# Patient Record
Sex: Male | Born: 1960 | Race: Black or African American | Hispanic: No | Marital: Married | Smoking: Never smoker
Health system: Southern US, Community
[De-identification: ages and names within clinical notes are randomized; demographics above are authoritative.]

## PROBLEM LIST (undated history)

## (undated) DIAGNOSIS — J302 Other seasonal allergic rhinitis: Secondary | ICD-10-CM

## (undated) DIAGNOSIS — M2142 Flat foot [pes planus] (acquired), left foot: Secondary | ICD-10-CM

## (undated) DIAGNOSIS — M26629 Arthralgia of temporomandibular joint, unspecified side: Secondary | ICD-10-CM

## (undated) DIAGNOSIS — T7840XA Allergy, unspecified, initial encounter: Secondary | ICD-10-CM

## (undated) DIAGNOSIS — M722 Plantar fascial fibromatosis: Secondary | ICD-10-CM

## (undated) DIAGNOSIS — Z0000 Encounter for general adult medical examination without abnormal findings: Secondary | ICD-10-CM

## (undated) DIAGNOSIS — M2141 Flat foot [pes planus] (acquired), right foot: Secondary | ICD-10-CM

## (undated) DIAGNOSIS — K219 Gastro-esophageal reflux disease without esophagitis: Secondary | ICD-10-CM

## (undated) DIAGNOSIS — R945 Abnormal results of liver function studies: Secondary | ICD-10-CM

## (undated) DIAGNOSIS — E785 Hyperlipidemia, unspecified: Secondary | ICD-10-CM

## (undated) DIAGNOSIS — R000 Tachycardia, unspecified: Secondary | ICD-10-CM

## (undated) HISTORY — DX: Tachycardia, unspecified: R00.0

## (undated) HISTORY — DX: Encounter for general adult medical examination without abnormal findings: Z00.00

## (undated) HISTORY — PX: ESOPHAGOGASTRODUODENOSCOPY: SHX1529

## (undated) HISTORY — DX: Plantar fascial fibromatosis: M72.2

## (undated) HISTORY — DX: Allergy, unspecified, initial encounter: T78.40XA

## (undated) HISTORY — DX: Flat foot (pes planus) (acquired), right foot: M21.41

## (undated) HISTORY — DX: Abnormal results of liver function studies: R94.5

## (undated) HISTORY — DX: Gastro-esophageal reflux disease without esophagitis: K21.9

## (undated) HISTORY — DX: Flat foot (pes planus) (acquired), right foot: M21.42

## (undated) HISTORY — PX: UPPER GASTROINTESTINAL ENDOSCOPY: SHX188

## (undated) HISTORY — PX: COLONOSCOPY: SHX174

## (undated) HISTORY — DX: Arthralgia of temporomandibular joint, unspecified side: M26.629

## (undated) HISTORY — DX: Other seasonal allergic rhinitis: J30.2

## (undated) HISTORY — DX: Hyperlipidemia, unspecified: E78.5

---

## 1999-03-05 ENCOUNTER — Emergency Department (HOSPITAL_COMMUNITY): Admission: EM | Admit: 1999-03-05 | Discharge: 1999-03-05 | Payer: Self-pay | Admitting: Emergency Medicine

## 1999-06-21 ENCOUNTER — Emergency Department (HOSPITAL_COMMUNITY): Admission: EM | Admit: 1999-06-21 | Discharge: 1999-06-21 | Payer: Self-pay | Admitting: Emergency Medicine

## 2002-03-20 ENCOUNTER — Ambulatory Visit (HOSPITAL_COMMUNITY): Admission: RE | Admit: 2002-03-20 | Discharge: 2002-03-20 | Payer: Self-pay | Admitting: Gastroenterology

## 2007-12-22 ENCOUNTER — Ambulatory Visit: Payer: Self-pay | Admitting: *Deleted

## 2007-12-22 DIAGNOSIS — H109 Unspecified conjunctivitis: Secondary | ICD-10-CM | POA: Insufficient documentation

## 2007-12-22 DIAGNOSIS — J301 Allergic rhinitis due to pollen: Secondary | ICD-10-CM

## 2007-12-22 DIAGNOSIS — K219 Gastro-esophageal reflux disease without esophagitis: Secondary | ICD-10-CM

## 2007-12-22 DIAGNOSIS — K449 Diaphragmatic hernia without obstruction or gangrene: Secondary | ICD-10-CM

## 2008-01-11 ENCOUNTER — Ambulatory Visit: Payer: Self-pay | Admitting: *Deleted

## 2008-01-11 DIAGNOSIS — H5710 Ocular pain, unspecified eye: Secondary | ICD-10-CM | POA: Insufficient documentation

## 2008-04-02 ENCOUNTER — Ambulatory Visit: Payer: Self-pay | Admitting: *Deleted

## 2008-04-02 DIAGNOSIS — M722 Plantar fascial fibromatosis: Secondary | ICD-10-CM

## 2008-04-02 DIAGNOSIS — M2140 Flat foot [pes planus] (acquired), unspecified foot: Secondary | ICD-10-CM | POA: Insufficient documentation

## 2009-02-07 ENCOUNTER — Ambulatory Visit: Payer: Self-pay | Admitting: Internal Medicine

## 2009-02-07 DIAGNOSIS — R0989 Other specified symptoms and signs involving the circulatory and respiratory systems: Secondary | ICD-10-CM

## 2009-02-07 DIAGNOSIS — R0609 Other forms of dyspnea: Secondary | ICD-10-CM

## 2009-02-07 DIAGNOSIS — S93409A Sprain of unspecified ligament of unspecified ankle, initial encounter: Secondary | ICD-10-CM | POA: Insufficient documentation

## 2009-02-12 ENCOUNTER — Encounter: Payer: Self-pay | Admitting: Internal Medicine

## 2009-02-12 ENCOUNTER — Ambulatory Visit: Payer: Self-pay

## 2009-02-12 ENCOUNTER — Telehealth: Payer: Self-pay | Admitting: Internal Medicine

## 2009-03-04 ENCOUNTER — Ambulatory Visit: Payer: Self-pay | Admitting: Internal Medicine

## 2010-10-31 NOTE — Op Note (Signed)
   NAME:  Marc Allen, Marc Allen                           ACCOUNT NO.:  [id number]   MEDICAL RECORD NO.:  [id number]                   PATIENT TYPE:  AMB   LOCATION:  ENDO                                 FACILITY:  [facility]   PHYSICIAN:  James L. Malon Kindle., M.D.          DATE OF BIRTH:  1960-09-01   DATE OF PROCEDURE:  03/20/2002  DATE OF DISCHARGE:                                 OPERATIVE REPORT   PROCEDURE:  Esophagogastroduodenoscopy.   MEDICATIONS:  Cetacaine spray, fentanyl 50 mcg, Versed 5 mg IV.   INDICATIONS:  Dysphagia and chest pain.   DESCRIPTION OF PROCEDURE:  The procedure has been explained to the patient  and consent was obtained.  The patient in the left lateral decubitus  position, the Olympus video endoscope was inserted blindly in the esophagus  and advanced under direct visualization.  The stomach was entered, pylorus  was identified and passed.  The duodenum, including the bulb and second  portion were seen well and was unremarkable.  The scope was withdrawn back  into the stomach.  The duodenal bulb, pyloric channel, antrum, and body were  normal.  The fundus and cardia were seen well on retroflexed view and were  normal.  The distal esophagus was quite red and the GE junction was widely  patent.  There was wretching and reflux during the procedure.  The scope was  withdrawn and the patient tolerated the procedure well and was maintained on  low-flow oxygen and pulse oximeter throughout the procedure.   ASSESSMENT:  Gastroesophageal reflux disease.   PLAN:  Will give antireflux instructions, continue Aciphex, see back in the  office in 4-6 weeks.                                                James L. Malon Kindle., M.D.    Waldron Session  D:  03/20/2002  T:  03/20/2002  Job:  [id number]

## 2011-03-23 ENCOUNTER — Encounter: Payer: Self-pay | Admitting: Internal Medicine

## 2011-03-23 ENCOUNTER — Ambulatory Visit (INDEPENDENT_AMBULATORY_CARE_PROVIDER_SITE_OTHER)
Admission: RE | Admit: 2011-03-23 | Discharge: 2011-03-23 | Disposition: A | Discharge status: Home / Self Care | Payer: BC Managed Care – PPO | Source: Ambulatory Visit | Attending: Internal Medicine | Admitting: Internal Medicine

## 2011-03-23 ENCOUNTER — Ambulatory Visit (HOSPITAL_BASED_OUTPATIENT_CLINIC_OR_DEPARTMENT_OTHER)
Admission: RE | Admit: 2011-03-23 | Discharge: 2011-03-23 | Disposition: A | Discharge status: Home / Self Care | Payer: BC Managed Care – PPO | Source: Ambulatory Visit | Attending: Internal Medicine | Admitting: Internal Medicine

## 2011-03-23 ENCOUNTER — Other Ambulatory Visit: Payer: Self-pay | Admitting: Internal Medicine

## 2011-03-23 ENCOUNTER — Ambulatory Visit (INDEPENDENT_AMBULATORY_CARE_PROVIDER_SITE_OTHER): Payer: BC Managed Care – PPO | Admitting: Internal Medicine

## 2011-03-23 DIAGNOSIS — N433 Hydrocele, unspecified: Secondary | ICD-10-CM

## 2011-03-23 DIAGNOSIS — N509 Disorder of male genital organs, unspecified: Secondary | ICD-10-CM | POA: Insufficient documentation

## 2011-03-23 DIAGNOSIS — N50812 Left testicular pain: Secondary | ICD-10-CM

## 2011-03-23 DIAGNOSIS — Z23 Encounter for immunization: Secondary | ICD-10-CM

## 2011-03-23 DIAGNOSIS — R319 Hematuria, unspecified: Secondary | ICD-10-CM | POA: Insufficient documentation

## 2011-03-23 DIAGNOSIS — N50819 Testicular pain, unspecified: Secondary | ICD-10-CM | POA: Insufficient documentation

## 2011-03-23 LAB — URINALYSIS, ROUTINE W REFLEX MICROSCOPIC
Glucose, UA: NEGATIVE mg/dL
Protein, ur: 30 mg/dL — AB

## 2011-03-23 MED ORDER — LEVOFLOXACIN 500 MG PO TABS: 500.0000 mg | ORAL_TABLET | Freq: Every day | ORAL | Status: AC

## 2011-03-23 NOTE — Assessment & Plan Note (Signed)
With associated penile blood. Obtain ua with micro + cx. Begin levaquin x 10days. Obtain scrotal US. Schedule close followup. Consider urology evaluation pending re-evaluation.

## 2011-03-23 NOTE — Progress Notes (Signed)
  Subjective:    Patient ID: Marc Allen, male    DOB: 1960-07-22, 50 y.o.   MRN: [id number]  HPI Pt presents to clinic for evaluation of penile blood. Yesterday had erection then internal shaft discomfort. Pain developed in testicles and suprapubic area. This am noted blood on underwear. No gross active bleeding. Denies urethral discharge, fever, chills, change in urination or concern/risk for STD. Recalls single episode of hematospermia approximately 7 years ago without recurrence. No exacerbating or alleviating factors. No other complaints.  Past Medical History  Diagnosis Date  . Hiatal hernia   . GERD (gastroesophageal reflux disease)     not currenlty on any meds  . Seasonal allergies   . Plantar fasciitis   . Flat feet    Past Surgical History  Procedure Date  . No past surgeries     denies surgical history    reports that he has never smoked. He has never used smokeless tobacco. He reports that he does not drink alcohol or use illicit drugs. family history includes Alcohol abuse in his paternal grandfather and paternal uncle; Diabetes in his paternal uncle; Hypertension in his father; and Lung cancer in his maternal aunt.  There is no history of Colon cancer and Prostate cancer. No Known Allergies     Review of Systems see hpi    Objective:   Physical Exam  Nursing note and vitals reviewed. Constitutional: He appears well-developed and well-nourished. No distress.  HENT:  Head: Normocephalic and atraumatic.  Eyes: Conjunctivae are normal. No scleral icterus.  Genitourinary:       Small amount of dried blood in underwear. No blood or discharge noted in urethral meatus. Penis NT. Bilaterally descended testes. Minimal tenderness right epididymis. Left testicular and epididymis moderately tender. No obvious mass.  Neurological: He is alert.  Skin: Skin is warm and dry. He is not diaphoretic.  Psychiatric: He has a normal mood and affect.          Assessment & Plan:

## 2011-03-24 DIAGNOSIS — Z23 Encounter for immunization: Secondary | ICD-10-CM

## 2011-03-24 LAB — URINALYSIS, MICROSCOPIC ONLY
Bacteria, UA: NONE SEEN
Casts: NONE SEEN
Crystals: NONE SEEN
RBC / HPF: >50 RBC/hpf — AB
Squamous Epithelial / HPF: NONE SEEN

## 2011-03-27 ENCOUNTER — Ambulatory Visit (INDEPENDENT_AMBULATORY_CARE_PROVIDER_SITE_OTHER): Payer: BC Managed Care – PPO | Admitting: Internal Medicine

## 2011-03-27 ENCOUNTER — Encounter: Payer: Self-pay | Admitting: Internal Medicine

## 2011-03-27 VITALS — BP 96/64 | HR 54 | Temp 97.9°F | Resp 16

## 2011-03-27 DIAGNOSIS — N509 Disorder of male genital organs, unspecified: Secondary | ICD-10-CM

## 2011-03-27 DIAGNOSIS — N50819 Testicular pain, unspecified: Secondary | ICD-10-CM

## 2011-03-27 NOTE — Assessment & Plan Note (Signed)
Sx's improving with abx. Complete 10d course. Schedule follow up in week or sooner if needed.

## 2011-03-27 NOTE — Progress Notes (Signed)
  Subjective:    Patient ID: Marc Allen, male    DOB: August 12, 1960, 50 y.o.   MRN: [id number]  HPI Pt presents to clinic for follow up of testicular pain. Reviewed scrotal US without acute finding. Urine cx demonstrates >100k of group b strep. Tolerating day #3 of 10 of levaquin without side effect. Notes improvement with less testicular tenderness and has seen only minimal blood from urethra most recently. Denies f/c or urinary sx's. Notes h/o GERD s/p lifestyle changes such as avoiding food 3 hours prior to bedtime. Currently has only sporadic heartburn less than 2x/week. Not taking medication currently. Wishes to schedule physical this year. No other complaints.  Past Medical History  Diagnosis Date  . Hiatal hernia   . GERD (gastroesophageal reflux disease)     not currenlty on any meds  . Seasonal allergies   . Plantar fasciitis   . Flat feet    Past Surgical History  Procedure Date  . No past surgeries     denies surgical history    reports that he has never smoked. He has never used smokeless tobacco. He reports that he does not drink alcohol or use illicit drugs. family history includes Alcohol abuse in his paternal grandfather and paternal uncle; Diabetes in his paternal uncle; Hypertension in his father; and Lung cancer in his maternal aunt.  There is no history of Colon cancer and Prostate cancer. No Known Allergies     Review of Systems see hpi     Objective:   Physical Exam  Nursing note and vitals reviewed. Constitutional: He appears well-developed and well-nourished. No distress.  Neurological: He is alert.  Skin: He is not diaphoretic.  Psychiatric: He has a normal mood and affect.          Assessment & Plan:

## 2011-04-03 ENCOUNTER — Encounter: Payer: Self-pay | Admitting: Internal Medicine

## 2011-04-03 ENCOUNTER — Ambulatory Visit (INDEPENDENT_AMBULATORY_CARE_PROVIDER_SITE_OTHER): Payer: BC Managed Care – PPO | Admitting: Internal Medicine

## 2011-04-03 VITALS — BP 100/60 | HR 60 | Temp 98.0°F | Resp 16 | Wt 163.0 lb

## 2011-04-03 DIAGNOSIS — N50819 Testicular pain, unspecified: Secondary | ICD-10-CM

## 2011-04-03 DIAGNOSIS — N509 Disorder of male genital organs, unspecified: Secondary | ICD-10-CM

## 2011-04-03 MED ORDER — LEVOFLOXACIN 500 MG PO TABS: 500.0000 mg | ORAL_TABLET | Freq: Every day | ORAL | Status: AC

## 2011-04-03 NOTE — Progress Notes (Signed)
  Subjective:    Patient ID: Marc Allen, male    DOB: September 13, 1960, 50 y.o.   MRN: [id number]  HPI Pt presents to clinic for follow up of testicular pain. With #9/10 abx his testicular tenderness/pain has nearly resolved. Denies any further hematuria. Does have mild urinary urgency. overally feels 70% better. No fever or chills. No other complaints.  Past Medical History  Diagnosis Date  . Hiatal hernia   . GERD (gastroesophageal reflux disease)     not currenlty on any meds  . Seasonal allergies   . Plantar fasciitis   . Flat feet    Past Surgical History  Procedure Date  . No past surgeries     denies surgical history    reports that he has never smoked. He has never used smokeless tobacco. He reports that he does not drink alcohol or use illicit drugs. family history includes Alcohol abuse in his paternal grandfather and paternal uncle; Diabetes in his paternal uncle; Hypertension in his father; and Lung cancer in his maternal aunt.  There is no history of Colon cancer and Prostate cancer. No Known Allergies     Review of Systems see hpi     Objective:   Physical Exam  Nursing note and vitals reviewed. Constitutional: He appears well-developed and well-nourished. No distress.  HENT:  Head: Normocephalic and atraumatic.  Eyes: Conjunctivae are normal. No scleral icterus.  Neurological: He is alert.  Skin: He is not diaphoretic.  Psychiatric: He has a normal mood and affect.          Assessment & Plan:

## 2011-04-03 NOTE — Patient Instructions (Signed)
Please schedule fasting labs before your physical: cbc, chem7, lipid, lft, tsh, urinalysis v70.0

## 2011-04-03 NOTE — Assessment & Plan Note (Signed)
Associated gross hematuria now resolved. Urine cx + for group b strep. Extend levaquin for additional 10 days. If sx's do not entire resolve, worsen or recur then pursue urology consult. Schedule cpe in ~ 3 wks.

## 2011-04-27 ENCOUNTER — Encounter: Payer: Self-pay | Admitting: Internal Medicine

## 2011-04-27 ENCOUNTER — Ambulatory Visit (INDEPENDENT_AMBULATORY_CARE_PROVIDER_SITE_OTHER): Payer: BC Managed Care – PPO | Admitting: Internal Medicine

## 2011-04-27 VITALS — BP 100/70 | HR 50 | Temp 98.1°F | Resp 16 | Ht 72.0 in | Wt 164.0 lb

## 2011-04-27 DIAGNOSIS — Z0000 Encounter for general adult medical examination without abnormal findings: Secondary | ICD-10-CM

## 2011-04-27 LAB — URINALYSIS, ROUTINE W REFLEX MICROSCOPIC
Ketones, ur: NEGATIVE mg/dL
Leukocytes, UA: NEGATIVE
Nitrite: NEGATIVE
Specific Gravity, Urine: 1.024 (ref 1.005–1.030)
pH: 6 (ref 5.0–8.0)

## 2011-04-27 LAB — PSA: PSA: 0.94 ng/mL (ref <=4.00)

## 2011-04-27 LAB — BASIC METABOLIC PANEL
CO2: 26 mEq/L (ref 19–32)
Calcium: 9.9 mg/dL (ref 8.4–10.5)
Creat: 0.89 mg/dL (ref 0.50–1.35)
Glucose, Bld: 82 mg/dL (ref 70–99)
Sodium: 143 mEq/L (ref 135–145)

## 2011-04-27 LAB — HEPATIC FUNCTION PANEL
AST: 20 U/L (ref 0–37)
Bilirubin, Direct: 0.2 mg/dL (ref 0.0–0.3)
Total Bilirubin: 0.4 mg/dL (ref 0.3–1.2)

## 2011-04-27 LAB — TSH: TSH: 0.558 u[IU]/mL (ref 0.350–4.500)

## 2011-04-27 LAB — CBC
Hemoglobin: 14.7 g/dL (ref 13.0–17.0)
MCH: 33.3 pg (ref 26.0–34.0)
MCV: 94.8 fL (ref 78.0–100.0)
RBC: 4.41 MIL/uL (ref 4.22–5.81)

## 2011-04-27 LAB — LIPID PANEL: Total CHOL/HDL Ratio: 3 Ratio

## 2011-04-27 MED ORDER — DICLOFENAC SODIUM 75 MG PO TBEC: DELAYED_RELEASE_TABLET | ORAL | Status: AC

## 2011-04-27 NOTE — Patient Instructions (Addendum)
Please schedule cbc, chem7, lipid, liver, tsh, psa, ua v70.0 prior to next year's physical

## 2011-04-27 NOTE — Progress Notes (Signed)
  Subjective:    Patient ID: Marc Allen, male    DOB: 11-09-1960, 50 y.o.   MRN: [id number]  HPI Pt presents to clinic for annual physical. No further hematuria or testicular pain s/p abx tx for +urine cx. Notes post neck pain in area of c7/t1 without trauma or radicular sx's.also right knee pain without injury or instability. Not taking medication for the problems. No other complaints  Past Medical History  Diagnosis Date  . Hiatal hernia   . GERD (gastroesophageal reflux disease)     not currenlty on any meds  . Seasonal allergies   . Plantar fasciitis   . Flat feet    Past Surgical History  Procedure Date  . No past surgeries     denies surgical history    reports that he has never smoked. He has never used smokeless tobacco. He reports that he does not drink alcohol or use illicit drugs. family history includes Alcohol abuse in his paternal grandfather and paternal uncle; Diabetes in his paternal uncle; Hypertension in his father; and Lung cancer in his maternal aunt.  There is no history of Colon cancer and Prostate cancer. No Known Allergies     Review of Systems see hpi     Objective:   Physical Exam  Physical Exam  Nursing note and vitals reviewed. Constitutional: He appears well-developed and well-nourished. No distress.  HENT:  Head: Normocephalic and atraumatic.  Right Ear: Tympanic membrane and external ear normal.  Left Ear: Tympanic membrane and external ear normal.  Nose: Nose normal.  Mouth/Throat: Uvula is midline, oropharynx is clear and moist and mucous membranes are normal. No oropharyngeal exudate.  Eyes: Conjunctivae and EOM are normal. Pupils are equal, round, and reactive to light. Right eye exhibits no discharge. Left eye exhibits no discharge. No scleral icterus.  Neck: Neck supple. Carotid bruit is not present. No thyromegaly present.  Cardiovascular: Normal rate, regular rhythm and normal heart sounds.  Exam reveals no gallop and no friction  rub.   No murmur Wing. Pulmonary/Chest: Effort normal and breath sounds normal. No respiratory distress. He has no wheezes. He has no rales.  Abdominal: Soft. He exhibits no distension and no mass. There is no hepatosplenomegaly. There is no tenderness. There is no rebound. Hernia confirmed negative in the right inguinal area and confirmed negative in the left inguinal area.  Genitourinary:Right testis shows no mass, no swelling and no tenderness. Right testis is descended. Left testis shows no mass, no swelling and no tenderness. Left testis is descended.  Lymphadenopathy:    He has no cervical adenopathy.       Right: No inguinal adenopathy present.       Left: No inguinal adenopathy present.  Neurological: He is alert.  Skin: Skin is warm and dry. He is not diaphoretic.  Psychiatric: He has a normal mood and affect.   MSK: FROM right knee and neck. No post vert tenderness or bony abn. Right knee with mild tenderness along medial aspect. No erythema warmth or effusion.     Assessment & Plan:

## 2011-05-03 DIAGNOSIS — Z0000 Encounter for general adult medical examination without abnormal findings: Secondary | ICD-10-CM

## 2011-05-03 HISTORY — DX: Encounter for general adult medical examination without abnormal findings: Z00.00

## 2011-05-03 NOTE — Assessment & Plan Note (Signed)
Obtain screening labs including psa (discussed pro's and cons). ekg obtained shows sb 48 with nl intervals and axis. Attempt voltaren with food and no other nsaids for knee and neck pain. Followup if no improvement or worsening.

## 2012-02-24 ENCOUNTER — Ambulatory Visit (INDEPENDENT_AMBULATORY_CARE_PROVIDER_SITE_OTHER): Payer: BC Managed Care – PPO | Admitting: Internal Medicine

## 2012-02-24 ENCOUNTER — Encounter: Payer: Self-pay | Admitting: Internal Medicine

## 2012-02-24 VITALS — BP 116/82 | HR 65 | Temp 98.6°F | Resp 18 | Wt 157.0 lb

## 2012-02-24 DIAGNOSIS — R509 Fever, unspecified: Secondary | ICD-10-CM

## 2012-02-24 DIAGNOSIS — B349 Viral infection, unspecified: Secondary | ICD-10-CM

## 2012-02-24 DIAGNOSIS — B9789 Other viral agents as the cause of diseases classified elsewhere: Secondary | ICD-10-CM

## 2012-02-24 DIAGNOSIS — E860 Dehydration: Secondary | ICD-10-CM

## 2012-02-24 LAB — CBC WITH DIFFERENTIAL/PLATELET
Lymphocytes Relative: 7 % — ABNORMAL LOW (ref 12–46)
Lymphs Abs: 0.9 10*3/uL (ref 0.7–4.0)
MCV: 93.3 fL (ref 78.0–100.0)
Neutro Abs: 10.5 10*3/uL — ABNORMAL HIGH (ref 1.7–7.7)
Neutrophils Relative %: 88 % — ABNORMAL HIGH (ref 43–77)
Platelets: 218 10*3/uL (ref 150–400)
RBC: 4.49 MIL/uL (ref 4.22–5.81)
WBC: 11.9 10*3/uL — ABNORMAL HIGH (ref 4.0–10.5)

## 2012-02-24 MED ORDER — ONDANSETRON HCL 8 MG PO TABS: 8.0000 mg | ORAL_TABLET | Freq: Three times a day (TID) | ORAL | Status: AC | PRN

## 2012-02-25 LAB — BASIC METABOLIC PANEL
CO2: 29 mEq/L (ref 19–32)
Chloride: 98 mEq/L (ref 96–112)
Sodium: 137 mEq/L (ref 135–145)

## 2012-03-02 ENCOUNTER — Other Ambulatory Visit: Payer: Self-pay | Admitting: *Deleted

## 2012-03-02 MED ORDER — POTASSIUM CHLORIDE CRYS ER 20 MEQ PO TBCR: 20.0000 meq | EXTENDED_RELEASE_TABLET | Freq: Every day | ORAL | Status: DC

## 2012-03-04 DIAGNOSIS — B349 Viral infection, unspecified: Secondary | ICD-10-CM | POA: Insufficient documentation

## 2012-03-04 NOTE — Progress Notes (Signed)
  Subjective:    Patient ID: RYSHAWN WIEGAND, male    DOB: [birth year], 51 y.o.   MRN: [id number]  HPI patient presents to clinic for evaluation of multiple complaints. Notes diffuse joint pain, abdominal bloating, loss of appetite nausea vomiting diarrhea sweats chills and fevers for the past three days. Had sudden onset with temperature maximum of 101.9. Has had loose watery stool without blood. At 5-10 bowel movements yesterday today only 3-4. No alleviating or exacerbating factors. Taking no medication for the problem.  Past Medical History  Diagnosis Date  . Hiatal hernia   . GERD (gastroesophageal reflux disease)     not currenlty on any meds  . Seasonal allergies   . Plantar fasciitis   . Flat feet    Past Surgical History  Procedure Date  . No past surgeries     denies surgical history    reports that he has never smoked. He has never used smokeless tobacco. He reports that he does not drink alcohol or use illicit drugs. family history includes Alcohol abuse in his paternal grandfather and paternal uncle; Diabetes in his paternal uncle; Hypertension in his father; and Lung cancer in his maternal aunt.  There is no history of Colon cancer and Prostate cancer. No Known Allergies   Review of Systems see hpi     Objective:   Physical Exam  Constitutional: He appears well-developed and well-nourished. No distress.  HENT:  Head: Normocephalic and atraumatic.  Left Ear: External ear normal.  Eyes: Conjunctivae normal are normal. No scleral icterus.  Neck: Neck supple.  Cardiovascular: Normal rate, regular rhythm and normal heart sounds.   No murmur [place]. Pulmonary/Chest: Effort normal and breath sounds normal. No respiratory distress. He has no wheezes. He has no rales.  Abdominal: Soft. Bowel sounds are normal. He exhibits no distension. There is no tenderness.  Neurological: He is alert.  Skin: Skin is warm and dry. He is not diaphoretic.  Psychiatric: He has a normal mood  and affect.          Assessment & Plan:

## 2012-03-04 NOTE — Assessment & Plan Note (Signed)
Suspect a viral etiology for possible gastroenteritis. Increase by mouth fluid intake. Obtain CBC and Chem-7. Use over-the-counter Imodium when necessary. Begin Zofran when necessary. Followup if no improvement or worsening.

## 2012-03-07 ENCOUNTER — Ambulatory Visit: Payer: BC Managed Care – PPO | Admitting: Internal Medicine

## 2012-03-08 ENCOUNTER — Other Ambulatory Visit: Payer: Self-pay | Admitting: Family

## 2012-03-08 ENCOUNTER — Encounter: Payer: Self-pay | Admitting: Family

## 2012-03-08 ENCOUNTER — Ambulatory Visit (INDEPENDENT_AMBULATORY_CARE_PROVIDER_SITE_OTHER): Payer: BC Managed Care – PPO | Admitting: Family

## 2012-03-08 VITALS — BP 104/80 | HR 66 | Temp 98.0°F | Resp 16 | Wt 157.1 lb

## 2012-03-08 DIAGNOSIS — R300 Dysuria: Secondary | ICD-10-CM

## 2012-03-08 DIAGNOSIS — D72829 Elevated white blood cell count, unspecified: Secondary | ICD-10-CM

## 2012-03-08 DIAGNOSIS — N390 Urinary tract infection, site not specified: Secondary | ICD-10-CM

## 2012-03-08 DIAGNOSIS — E876 Hypokalemia: Secondary | ICD-10-CM

## 2012-03-08 DIAGNOSIS — Z23 Encounter for immunization: Secondary | ICD-10-CM

## 2012-03-08 LAB — POCT URINALYSIS DIPSTICK
Bilirubin, UA: NEGATIVE
Glucose, UA: NEGATIVE
Nitrite, UA: NEGATIVE
Spec Grav, UA: <=1.005

## 2012-03-08 LAB — BASIC METABOLIC PANEL
Chloride: 102 mEq/L (ref 96–112)
Creat: 0.78 mg/dL (ref 0.50–1.35)
Potassium: 4.7 mEq/L (ref 3.5–5.3)

## 2012-03-08 LAB — CBC WITH DIFFERENTIAL/PLATELET
Eosinophils Absolute: 0.1 10*3/uL (ref 0.0–0.7)
Hemoglobin: 14.8 g/dL (ref 13.0–17.0)
Lymphocytes Relative: 34 % (ref 12–46)
Lymphs Abs: 2.2 10*3/uL (ref 0.7–4.0)
MCH: 32.8 pg (ref 26.0–34.0)
Monocytes Relative: 9 % (ref 3–12)
Neutrophils Relative %: 55 % (ref 43–77)
Platelets: 390 10*3/uL (ref 150–400)
RBC: 4.51 MIL/uL (ref 4.22–5.81)
WBC: 6.4 10*3/uL (ref 4.0–10.5)

## 2012-03-08 MED ORDER — CIPROFLOXACIN HCL 500 MG PO TABS: 500.0000 mg | ORAL_TABLET | Freq: Two times a day (BID) | ORAL | Status: DC

## 2012-03-08 NOTE — Patient Instructions (Addendum)
Call if symptoms worsen, if you develop fever, blood in urine,low back pain, or if not improved in 2-3 days. Go to ER if you are unable to urinate.

## 2012-03-08 NOTE — Progress Notes (Signed)
  Subjective:    Patient ID: Marc Allen, male    DOB: 11/27/1960, 51 y.o.   MRN: [id number]  HPI  Mr.  Marc Allen is a 51 yr old male who presents today with chief complaint of dysuria.  He reports that dysuria has been present x 2 months.  He report + hx of prostatitis. Denies associated low back pain, fever or hematuria.    Review of Systems    see HPI  Past Medical History  Diagnosis Date  . Hiatal hernia   . GERD (gastroesophageal reflux disease)     not currenlty on any meds  . Seasonal allergies   . Plantar fasciitis   . Flat feet     History   Social History  . Marital Status: Married    Spouse Name: N/A    Number of Children: N/A  . Years of Education: N/A   Occupational History  . Not on file.   Social History Main Topics  . Smoking status: Never Smoker   . Smokeless tobacco: Never Used  . Alcohol Use: No  . Drug Use: No  . Sexually Active: Not on file   Other Topics Concern  . Not on file   Social History Narrative   [employer] DriverMarried x 15 years    Past Surgical History  Procedure Date  . No past surgeries     denies surgical history    Family History  Problem Relation Age of Onset  . Alcohol abuse Paternal Grandfather   . Alcohol abuse Paternal Uncle   . Diabetes Paternal Uncle   . Hypertension Father   . Lung cancer Maternal Aunt     smoker  . Colon cancer Neg Hx   . Prostate cancer Neg Hx     No Known Allergies  Current Outpatient Prescriptions on File Prior to Visit  Medication Sig Dispense Refill  . potassium chloride SA (K-DUR,KLOR-CON) 20 MEQ tablet Take 1 tablet (20 mEq total) by mouth daily.  3 tablet  0    BP 104/80  Pulse 66  Temp 98 F (36.7 C) (Oral)  Resp 16  Wt 157 lb 1.3 oz (71.251 kg)  SpO2 99%    Objective:   Physical Exam  Constitutional: He is oriented to person, place, and time. He appears well-developed and well-nourished. No distress.  Cardiovascular: Normal rate and regular rhythm.   No murmur  [place]. Pulmonary/Chest: Effort normal and breath sounds normal. No respiratory distress. He has no wheezes. He has no rales. He exhibits no tenderness.  Abdominal: Soft. Bowel sounds are normal. He exhibits no distension. There is no tenderness.  Genitourinary:       Neg CVAT bilaterally.  Musculoskeletal: He exhibits no edema.  Neurological: He is alert and oriented to person, place, and time.  Psychiatric: He has a normal mood and affect. His behavior is normal. Judgment and thought content normal.          Assessment & Plan:

## 2012-03-09 LAB — GC/CHLAMYDIA PROBE AMP, URINE
Chlamydia, Swab/Urine, PCR: NEGATIVE
GC Probe Amp, Urine: NEGATIVE

## 2012-03-10 LAB — URINE CULTURE: Colony Count: 40000

## 2012-03-11 ENCOUNTER — Telehealth: Payer: Self-pay | Admitting: *Deleted

## 2012-03-11 NOTE — Telephone Encounter (Signed)
Patient notified of test results negative and  Urine culture with bacteria and the importance of taking antibiotic. Patient states will pick up antibiotic today and get started with it. Has moved follow up appt. With Dr Ty Hilts to 04/25/2012 due to work.

## 2012-03-11 NOTE — Telephone Encounter (Signed)
Message copied by Elnora Morrison on Fri Mar 11, 2012  1:15 PM ------      Message from: O'SULLIVAN, MELISSA      Created: Fri Mar 11, 2012 12:56 PM       Pls call pt and let him know that his gonorrhea/chlamydia screen is negative. Urine culture grew a small amount of bacteria.  I would like him to continue cipro and plan to follow up with Dr. Rodena Medin as scheduled.  Let us know if symptoms worsen, or if no improvement.

## 2012-03-13 DIAGNOSIS — N419 Inflammatory disease of prostate, unspecified: Secondary | ICD-10-CM | POA: Insufficient documentation

## 2012-03-13 NOTE — Assessment & Plan Note (Signed)
Will plan to treat for UTI.  UA notes trace blood and + leuks.  Culture urine.  Rx with cipro x 10 days due to hx of prostatitis.

## 2012-03-14 ENCOUNTER — Telehealth: Payer: Self-pay | Admitting: *Deleted

## 2012-03-14 LAB — PSA: PSA: 14.73 ng/mL — ABNORMAL HIGH (ref <=4.00)

## 2012-03-14 NOTE — Telephone Encounter (Signed)
Lab states they did not receive the order. Test has been added.

## 2012-03-14 NOTE — Telephone Encounter (Signed)
Message copied by Kathi Simpers on Mon Mar 14, 2012  9:07 AM ------      Message from: [place], MELISSA      Created: Sun Mar 13, 2012  9:06 PM       Could you pls check psa status from 9/24? thanks

## 2012-03-15 ENCOUNTER — Ambulatory Visit: Payer: BC Managed Care – PPO | Admitting: Internal Medicine

## 2012-03-15 DIAGNOSIS — Z0289 Encounter for other administrative examinations: Secondary | ICD-10-CM

## 2012-03-17 ENCOUNTER — Encounter: Payer: Self-pay | Admitting: Internal Medicine

## 2012-03-17 ENCOUNTER — Ambulatory Visit (INDEPENDENT_AMBULATORY_CARE_PROVIDER_SITE_OTHER): Payer: BC Managed Care – PPO | Admitting: Internal Medicine

## 2012-03-17 VITALS — BP 100/70 | HR 60 | Temp 97.6°F | Resp 16 | Wt 159.0 lb

## 2012-03-17 DIAGNOSIS — N419 Inflammatory disease of prostate, unspecified: Secondary | ICD-10-CM

## 2012-03-20 NOTE — Assessment & Plan Note (Signed)
Continue antibiotics to completion. Notify clinic of progress. If  does not respond to Cipro change to antibiotic appropriate to group B strep. Recommend repeat PSA after clinical improvement

## 2012-03-20 NOTE — Progress Notes (Signed)
  Subjective:    Patient ID: Marc Allen, male    DOB: [birth year], 51 y.o.   MRN: [id number]  HPI patient presents to clinic for followup of prostatitis. Recently seen with dysuria and dribbling. Urine culture demonstrated small quantity group B strep. Was prescribed ten days of Cipro however did not begin the medication until approximately 2 days ago. Symptoms are stable. Denies fever or chills. No hematuria. Reviewed elevated PSA of approximately 14 with baseline being normal. Total time of visit approximately twenty-two minutes of which greater than 50% of time was spent in counseling  Past Medical History  Diagnosis Date  . Hiatal hernia   . GERD (gastroesophageal reflux disease)     not currenlty on any meds  . Seasonal allergies   . Plantar fasciitis   . Flat feet    Past Surgical History  Procedure Date  . No past surgeries     denies surgical history    reports that he has never smoked. He has never used smokeless tobacco. He reports that he does not drink alcohol or use illicit drugs. family history includes Alcohol abuse in his paternal grandfather and paternal uncle; Diabetes in his paternal uncle; Hypertension in his father; and Lung cancer in his maternal aunt.  There is no history of Colon cancer and Prostate cancer. No Known Allergies   Review of Systems see hpi     Objective:   Physical Exam  Nursing note and vitals reviewed. Constitutional: He appears well-developed and well-nourished. No distress.  HENT:  Head: Normocephalic and atraumatic.  Neurological: He is alert.  Skin: He is not diaphoretic.  Psychiatric: He has a normal mood and affect.          Assessment & Plan:

## 2012-04-04 ENCOUNTER — Telehealth: Payer: Self-pay | Admitting: Internal Medicine

## 2012-04-04 MED ORDER — CIPROFLOXACIN HCL 500 MG PO TABS: 500.0000 mg | ORAL_TABLET | Freq: Two times a day (BID) | ORAL | Status: DC

## 2012-04-04 NOTE — Telephone Encounter (Signed)
Ok to repeat 

## 2012-04-04 NOTE — Telephone Encounter (Signed)
HE FEELS HE NEEDS ANOTHER 10 DAYS ON ANTIBIOTIC.  HE IS FEELING BETTER BUT THE SYMPTOMS HAVE NOT RESOLVED COMPLETELY.

## 2012-04-04 NOTE — Telephone Encounter (Signed)
ABX Rx to pharmacy; inform patient/SLS

## 2012-04-15 ENCOUNTER — Telehealth: Payer: Self-pay | Admitting: Internal Medicine

## 2012-04-15 DIAGNOSIS — R972 Elevated prostate specific antigen [PSA]: Secondary | ICD-10-CM

## 2012-04-15 NOTE — Telephone Encounter (Signed)
Patient states that he is still experiencing symptoms of uti. He says that his symptoms are lessening but are still there and he says that he is still taking the antibiotics that were prescribed to him. He would like to know if Dr. Rodena Medin could refer him to a Urologist?

## 2012-04-17 NOTE — Telephone Encounter (Signed)
1) he was supposed to call if cipro wasn't working well enough and i was going to switch abx. We discussed that in detail because of his urine cx. Stop cipro. Take amox 500mg  tid x14 days if not allergic. 2) return to lab and repeat psa- dx elevated psa 3) if different abx doesn't take care of sx then definitely urologist

## 2012-04-21 ENCOUNTER — Ambulatory Visit (INDEPENDENT_AMBULATORY_CARE_PROVIDER_SITE_OTHER): Payer: BC Managed Care – PPO | Admitting: Internal Medicine

## 2012-04-21 ENCOUNTER — Encounter: Payer: Self-pay | Admitting: Internal Medicine

## 2012-04-21 VITALS — BP 100/78 | HR 88 | Temp 97.8°F | Resp 14 | Wt 162.1 lb

## 2012-04-21 DIAGNOSIS — R972 Elevated prostate specific antigen [PSA]: Secondary | ICD-10-CM

## 2012-04-21 LAB — PSA: PSA: 2.36 ng/mL (ref <=4.00)

## 2012-04-21 MED ORDER — AMOXICILLIN-POT CLAVULANATE 875-125 MG PO TABS: 1.0000 | ORAL_TABLET | Freq: Two times a day (BID) | ORAL | Status: AC

## 2012-04-21 NOTE — Telephone Encounter (Signed)
Attempt to reach pt [2nd call--first 11.04.13], spoke with mother-in-law, who stated pt will be home after 3:00p today/SLS

## 2012-04-21 NOTE — Assessment & Plan Note (Signed)
Symptoms improved but not resolved. Attempt course of Augmentin for 10 days. Repeat PSA. If symptoms not resolved with this additional course of antibiotics we'll recommend proceeding with urology consult.

## 2012-04-21 NOTE — Patient Instructions (Signed)
Please schedule a physical in January with fasting labs prior to the appointment if possible (cbc, chem7, lipid, a1c, tsh, lft, psa and ua with reflex micr0-v70.0)

## 2012-04-21 NOTE — Progress Notes (Signed)
  Subjective:    Patient ID: ESTELLE HERSTON, male    DOB: [birth year], 51 y.o.   MRN: [id number]  HPI patient presents to clinic for followup of prostatitis. Initially diagnosed with what was felt to be clinically prostatitis. However also had a urine culture will low colony count of group B strep. Took a ten-day course of Cipro with significant improvement initially he rated 50%. Took second course of Cipro 10 days with no further significant improvement. Has no dysuria but has bladder pressure. No fever or chills. PSA was elevated at the time of diagnosis. No other alleviating or exacerbating factors  Past Medical History  Diagnosis Date  . Hiatal hernia   . GERD (gastroesophageal reflux disease)     not currenlty on any meds  . Seasonal allergies   . Plantar fasciitis   . Flat feet    Past Surgical History  Procedure Date  . No past surgeries     denies surgical history    reports that he has never smoked. He has never used smokeless tobacco. He reports that he does not drink alcohol or use illicit drugs. family history includes Alcohol abuse in his paternal grandfather and paternal uncle; Diabetes in his paternal uncle; Hypertension in his father; and Lung cancer in his maternal aunt.  There is no history of Colon cancer and Prostate cancer. No Known Allergies  Review of Systems see hpi     Objective:   Physical Exam  Nursing note and vitals reviewed. Constitutional: He appears well-developed and well-nourished. No distress.  Skin: He is not diaphoretic.          Assessment & Plan:

## 2012-04-22 NOTE — Telephone Encounter (Signed)
Pt seen in office 11.07.13/SLS

## 2012-04-25 ENCOUNTER — Encounter: Payer: BC Managed Care – PPO | Admitting: Internal Medicine

## 2012-06-20 ENCOUNTER — Encounter: Payer: Self-pay | Admitting: Internal Medicine

## 2012-06-20 ENCOUNTER — Ambulatory Visit (INDEPENDENT_AMBULATORY_CARE_PROVIDER_SITE_OTHER): Payer: BC Managed Care – PPO | Admitting: Internal Medicine

## 2012-06-20 VITALS — BP 102/72 | HR 68 | Temp 97.9°F | Resp 16 | Ht 71.5 in | Wt 158.2 lb

## 2012-06-20 DIAGNOSIS — Z0000 Encounter for general adult medical examination without abnormal findings: Secondary | ICD-10-CM

## 2012-06-20 LAB — CBC WITH DIFFERENTIAL/PLATELET
Basophils Absolute: 0 10*3/uL (ref 0.0–0.1)
Eosinophils Relative: 1 % (ref 0–5)
Lymphocytes Relative: 35 % (ref 12–46)
Lymphs Abs: 1.1 10*3/uL (ref 0.7–4.0)
MCV: 92.5 fL (ref 78.0–100.0)
Neutro Abs: 1.8 10*3/uL (ref 1.7–7.7)
Neutrophils Relative %: 54 % (ref 43–77)
Platelets: 265 10*3/uL (ref 150–400)
RBC: 4.77 MIL/uL (ref 4.22–5.81)
RDW: 14.3 % (ref 11.5–15.5)
WBC: 3.3 10*3/uL — ABNORMAL LOW (ref 4.0–10.5)

## 2012-06-20 LAB — BASIC METABOLIC PANEL
CO2: 30 mEq/L (ref 19–32)
Chloride: 105 mEq/L (ref 96–112)
Creat: 0.93 mg/dL (ref 0.50–1.35)
Potassium: 4.5 mEq/L (ref 3.5–5.3)
Sodium: 143 mEq/L (ref 135–145)

## 2012-06-20 LAB — HEPATIC FUNCTION PANEL
ALT: 17 U/L (ref 0–53)
AST: 22 U/L (ref 0–37)
Bilirubin, Direct: 0.1 mg/dL (ref 0.0–0.3)
Indirect Bilirubin: 0.5 mg/dL (ref 0.0–0.9)
Total Protein: 7.4 g/dL (ref 6.0–8.3)

## 2012-06-20 LAB — LIPID PANEL
Cholesterol: 182 mg/dL (ref 0–200)
Total CHOL/HDL Ratio: 3.2 Ratio
Triglycerides: 40 mg/dL (ref <150)
VLDL: 8 mg/dL (ref 0–40)

## 2012-06-20 LAB — TSH: TSH: 0.718 u[IU]/mL (ref 0.350–4.500)

## 2012-06-20 LAB — HEMOGLOBIN A1C: Mean Plasma Glucose: 111 mg/dL (ref <117)

## 2012-06-20 NOTE — Assessment & Plan Note (Signed)
Nl exam. Obtain cpe labs. 

## 2012-06-20 NOTE — Progress Notes (Signed)
  Subjective:    Patient ID: Marc Allen, male    DOB: 1960-09-22, 52 y.o.   MRN: [id number]  HPI Pt presents to clinic for annual exam. Previous prostatitis sx's resolved after abx tx. Transient elevation of psa during prostatitis resolved after abx completion.   Past Medical History  Diagnosis Date  . Hiatal hernia   . GERD (gastroesophageal reflux disease)     not currenlty on any meds  . Seasonal allergies   . Plantar fasciitis   . Flat feet    Past Surgical History  Procedure Date  . No past surgeries     denies surgical history    reports that he has never smoked. He has never used smokeless tobacco. He reports that he does not drink alcohol or use illicit drugs. family history includes Alcohol abuse in his paternal grandfather and paternal uncle; Diabetes in his paternal uncle; Hypertension in his father; and Lung cancer in his maternal aunt.  There is no history of Colon cancer and Prostate cancer. No Known Allergies   Review of Systems see hpi     Objective:   Physical Exam  Physical Exam  Nursing note and vitals reviewed. Constitutional: He appears well-developed and well-nourished. No distress.  HENT:  Head: Normocephalic and atraumatic.  Right Ear: Tympanic membrane and external ear normal.  Left Ear: Tympanic membrane and external ear normal.  Nose: Nose normal.  Mouth/Throat: Uvula is midline, oropharynx is clear and moist and mucous membranes are normal. No oropharyngeal exudate.  Eyes: Conjunctivae and EOM are normal. Pupils are equal, round, and reactive to light. Right eye exhibits no discharge. Left eye exhibits no discharge. No scleral icterus.  Neck: Neck supple. Carotid bruit is not present. No thyromegaly present.  Cardiovascular: Normal rate, regular rhythm and normal heart sounds.  Exam reveals no gallop and no friction rub.   No murmur Osso. Pulmonary/Chest: Effort normal and breath sounds normal. No respiratory distress. He has no wheezes. He  has no rales.  Abdominal: Soft. He exhibits no distension and no mass. There is no hepatosplenomegaly. There is no tenderness. There is no rebound. Hernia confirmed negative in the right inguinal area and confirmed negative in the left inguinal area.  Genitourinary: Rectum normal, prostate normal and testes normal. Rectal exam shows no mass and no tenderness. Guaiac negative stool. Prostate is not enlarged and not tender. Right testis shows no mass, no swelling and no tenderness. Right testis is descended. Left testis shows no mass, no swelling and no tenderness. Left testis is descended.  Lymphadenopathy:    He has no cervical adenopathy.       Right: No inguinal adenopathy present.       Left: No inguinal adenopathy present.  Neurological: He is alert.  Skin: Skin is warm and dry. He is not diaphoretic.  Psychiatric: He has a normal mood and affect.       Assessment & Plan:

## 2012-06-21 LAB — URINALYSIS, ROUTINE W REFLEX MICROSCOPIC
Bilirubin Urine: NEGATIVE
Hgb urine dipstick: NEGATIVE
Ketones, ur: NEGATIVE mg/dL
Specific Gravity, Urine: 1.026 (ref 1.005–1.030)
pH: 5 (ref 5.0–8.0)

## 2012-11-09 ENCOUNTER — Encounter: Payer: Self-pay | Admitting: Family

## 2012-11-09 ENCOUNTER — Ambulatory Visit (INDEPENDENT_AMBULATORY_CARE_PROVIDER_SITE_OTHER): Payer: BC Managed Care – PPO | Admitting: Family

## 2012-11-09 VITALS — BP 106/76 | HR 65 | Temp 97.7°F | Resp 16 | Wt 158.0 lb

## 2012-11-09 DIAGNOSIS — W57XXXA Bitten or stung by nonvenomous insect and other nonvenomous arthropods, initial encounter: Secondary | ICD-10-CM | POA: Insufficient documentation

## 2012-11-09 LAB — CBC WITH DIFFERENTIAL/PLATELET
Eosinophils Absolute: 0.1 10*3/uL (ref 0.0–0.7)
Eosinophils Relative: 1 % (ref 0–5)
Hemoglobin: 15.2 g/dL (ref 13.0–17.0)
Lymphs Abs: 1.5 10*3/uL (ref 0.7–4.0)
MCH: 32.4 pg (ref 26.0–34.0)
MCHC: 35.1 g/dL (ref 30.0–36.0)
MCV: 92.3 fL (ref 78.0–100.0)
Monocytes Absolute: 0.4 10*3/uL (ref 0.1–1.0)
Monocytes Relative: 8 % (ref 3–12)
RBC: 4.69 MIL/uL (ref 4.22–5.81)

## 2012-11-09 NOTE — Progress Notes (Signed)
  Subjective:    Patient ID: Marc Allen, male    DOB: 1961/04/08, 52 y.o.   MRN: [id number]  HPI  Marc Allen is a 52 yr old male who presents today to discuss a tick bite.  Pt reports tick on back of neck 2 weeks ago. Has had gradual onset of stiffness of his neck over the last week. Thinks he may have had a low grade fever for 2 days. Reports that he has not had any rash.  Denies joint pain other than neck and shoulders.  Denies current nausea/vomitting or photophobia.  2-3 days ago had a slight nausea which he attributed to "something I ate."   Reports that his 66 yr old son had 104 temp and was treat with antibiotics.     Review of Systems See HPI  Past Medical History  Diagnosis Date  . Hiatal hernia   . GERD (gastroesophageal reflux disease)     not currenlty on any meds  . Seasonal allergies   . Plantar fasciitis   . Flat feet     History   Social History  . Marital Status: Married    Spouse Name: N/A    Number of Children: N/A  . Years of Education: N/A   Occupational History  . Not on file.   Social History Main Topics  . Smoking status: Never Smoker   . Smokeless tobacco: Never Used  . Alcohol Use: No  . Drug Use: No  . Sexually Active: Not on file   Other Topics Concern  . Not on file   Social History Narrative   [employer]   Married x 15 years    Past Surgical History  Procedure Laterality Date  . No past surgeries      denies surgical history    Family History  Problem Relation Age of Onset  . Alcohol abuse Paternal Grandfather   . Alcohol abuse Paternal Uncle   . Diabetes Paternal Uncle   . Hypertension Father   . Lung cancer Maternal Aunt     smoker  . Colon cancer Neg Hx   . Prostate cancer Neg Hx     No Known Allergies  Current Outpatient Prescriptions on File Prior to Visit  Medication Sig Dispense Refill  . Multiple Vitamin (MULTIVITAMIN WITH MINERALS) TABS Take 1 tablet by mouth daily.      . potassium chloride SA  (K-DUR,KLOR-CON) 20 MEQ tablet Take 1 tablet (20 mEq total) by mouth daily.  3 tablet  0   No current facility-administered medications on file prior to visit.    BP 106/76  Pulse 65  Temp(Src) 97.7 F (36.5 C) (Oral)  Resp 16  Wt 158 lb 0.6 oz (71.686 kg)  BMI 21.74 kg/m2  SpO2 99%       Objective:   Physical Exam  Constitutional: He appears well-developed and well-nourished. No distress.  Cardiovascular: Normal rate and regular rhythm.   No murmur Plancarte. Pulmonary/Chest: Effort normal. No respiratory distress. He has no wheezes. He has no rales. He exhibits no tenderness.  Musculoskeletal: He exhibits no edema.  No cervical tenderness to palpation. No nuchal rigidity  Skin: no rash        Assessment & Plan:

## 2012-11-09 NOTE — Patient Instructions (Addendum)
Please complete lab work prior to leaving. Call if you develop worsening neck pain/stiffness, fever over 101, rash or joint pain.

## 2012-11-09 NOTE — Assessment & Plan Note (Signed)
Pt is afebrile without rash or joint pain.  Mild neck stiffness may be musculoskeletal.  Will check CBC as well as lyme antibodies.  Clinically doubt lyme disease.  He is instructed to contact us as noted in AVS.

## 2012-11-10 ENCOUNTER — Encounter: Payer: Self-pay | Admitting: Family

## 2013-04-20 ENCOUNTER — Other Ambulatory Visit: Payer: Self-pay

## 2013-06-23 ENCOUNTER — Telehealth: Payer: Self-pay | Admitting: Family Medicine

## 2013-06-23 ENCOUNTER — Encounter: Payer: Self-pay | Admitting: Family Medicine

## 2013-06-23 ENCOUNTER — Ambulatory Visit (INDEPENDENT_AMBULATORY_CARE_PROVIDER_SITE_OTHER): Payer: BC Managed Care – PPO | Admitting: Family Medicine

## 2013-06-23 VITALS — BP 108/82 | HR 54 | Temp 98.0°F | Ht 71.5 in | Wt 169.0 lb

## 2013-06-23 DIAGNOSIS — M26629 Arthralgia of temporomandibular joint, unspecified side: Secondary | ICD-10-CM | POA: Insufficient documentation

## 2013-06-23 DIAGNOSIS — Z0000 Encounter for general adult medical examination without abnormal findings: Secondary | ICD-10-CM

## 2013-06-23 DIAGNOSIS — R3911 Hesitancy of micturition: Secondary | ICD-10-CM

## 2013-06-23 DIAGNOSIS — N419 Inflammatory disease of prostate, unspecified: Secondary | ICD-10-CM

## 2013-06-23 DIAGNOSIS — J301 Allergic rhinitis due to pollen: Secondary | ICD-10-CM

## 2013-06-23 DIAGNOSIS — Z23 Encounter for immunization: Secondary | ICD-10-CM

## 2013-06-23 DIAGNOSIS — K219 Gastro-esophageal reflux disease without esophagitis: Secondary | ICD-10-CM

## 2013-06-23 DIAGNOSIS — K449 Diaphragmatic hernia without obstruction or gangrene: Secondary | ICD-10-CM

## 2013-06-23 DIAGNOSIS — M2669 Other specified disorders of temporomandibular joint: Secondary | ICD-10-CM

## 2013-06-23 DIAGNOSIS — E876 Hypokalemia: Secondary | ICD-10-CM

## 2013-06-23 DIAGNOSIS — N411 Chronic prostatitis: Secondary | ICD-10-CM

## 2013-06-23 LAB — RENAL FUNCTION PANEL
Albumin: 4.6 g/dL (ref 3.5–5.2)
BUN: 7 mg/dL (ref 6–23)
CHLORIDE: 105 meq/L (ref 96–112)
CO2: 29 meq/L (ref 19–32)
Calcium: 9.4 mg/dL (ref 8.4–10.5)
Creat: 0.84 mg/dL (ref 0.50–1.35)
Glucose, Bld: 79 mg/dL (ref 70–99)
PHOSPHORUS: 3 mg/dL (ref 2.3–4.6)
Potassium: 4.3 mEq/L (ref 3.5–5.3)
SODIUM: 141 meq/L (ref 135–145)

## 2013-06-23 LAB — HEPATIC FUNCTION PANEL
ALK PHOS: 58 U/L (ref 39–117)
ALT: 21 U/L (ref 0–53)
AST: 21 U/L (ref 0–37)
Albumin: 4.6 g/dL (ref 3.5–5.2)
BILIRUBIN DIRECT: 0.1 mg/dL (ref 0.0–0.3)
BILIRUBIN TOTAL: 0.6 mg/dL (ref 0.3–1.2)
Indirect Bilirubin: 0.5 mg/dL (ref 0.0–0.9)
Total Protein: 7.3 g/dL (ref 6.0–8.3)

## 2013-06-23 LAB — LIPID PANEL
CHOL/HDL RATIO: 3.2 ratio
Cholesterol: 165 mg/dL (ref 0–200)
HDL: 52 mg/dL (ref >39)
LDL Cholesterol: 105 mg/dL — ABNORMAL HIGH (ref 0–99)
TRIGLYCERIDES: 40 mg/dL (ref <150)
VLDL: 8 mg/dL (ref 0–40)

## 2013-06-23 LAB — CBC
HCT: 43.5 % (ref 39.0–52.0)
Hemoglobin: 15.1 g/dL (ref 13.0–17.0)
MCH: 32.8 pg (ref 26.0–34.0)
MCHC: 34.7 g/dL (ref 30.0–36.0)
MCV: 94.6 fL (ref 78.0–100.0)
PLATELETS: 252 10*3/uL (ref 150–400)
RBC: 4.6 MIL/uL (ref 4.22–5.81)
RDW: 14.6 % (ref 11.5–15.5)
WBC: 5.1 10*3/uL (ref 4.0–10.5)

## 2013-06-23 NOTE — Telephone Encounter (Signed)
Annual lipid, renal, cbc, tsh, hepatic, psa  [facility]

## 2013-06-23 NOTE — Progress Notes (Signed)
Pre visit review using our clinic review tool, if applicable. No additional management support is needed unless otherwise documented below in the visit note. 

## 2013-06-23 NOTE — Telephone Encounter (Signed)
Lab order placed.

## 2013-06-23 NOTE — Patient Instructions (Signed)
Preventive Care for Adults, Male A healthy lifestyle and preventive care can promote health and wellness. Preventive health guidelines for men include the following key practices:  A routine yearly physical is a good way to check with your caregiver about your health and preventative screening. It is a chance to share any concerns and updates on your health, and to receive a thorough exam.  Visit your dentist for a routine exam and preventative care every 6 months. Brush your teeth twice a day and floss once a day. Good oral hygiene prevents tooth decay and gum disease.  The frequency of eye exams is based on your age, health, family medical history, use of contact lenses, and other factors. Follow your caregiver's recommendations for frequency of eye exams.  Eat a healthy diet. Foods like vegetables, fruits, whole grains, low-fat dairy products, and lean protein foods contain the nutrients you need without too many calories. Decrease your intake of foods high in solid fats, added sugars, and salt. Eat the right amount of calories for you.Get information about a proper diet from your caregiver, if necessary.  Regular physical exercise is one of the most important things you can do for your health. Most adults should get at least 150 minutes of moderate-intensity exercise (any activity that increases your heart rate and causes you to sweat) each week. In addition, most adults need muscle-strengthening exercises on 2 or more days a week.  Maintain a healthy weight. The body mass index (BMI) is a screening tool to identify possible weight problems. It provides an estimate of body fat based on height and weight. Your caregiver can help determine your BMI, and can help you achieve or maintain a healthy weight.For adults 20 years and older:  A BMI below 18.5 is considered underweight.  A BMI of 18.5 to 24.9 is normal.  A BMI of 25 to 29.9 is considered overweight.  A BMI of 30 and above is  considered obese.  Maintain normal blood lipids and cholesterol levels by exercising and minimizing your intake of saturated fat. Eat a balanced diet with plenty of fruit and vegetables. Blood tests for lipids and cholesterol should begin at age 75 and be repeated every 5 years. If your lipid or cholesterol levels are high, you are over 50, or you are a high risk for heart disease, you may need your cholesterol levels checked more frequently.Ongoing high lipid and cholesterol levels should be treated with medicines if diet and exercise are not effective.  If you smoke, find out from your caregiver how to quit. If you do not use tobacco, do not start.  Lung cancer screening is recommended for adults aged 85 80 years who are at high risk for developing lung cancer because of a history of smoking. Yearly low-dose computed tomography (CT) is recommended for people who have at least a 30-pack-year history of smoking and are a current smoker or have quit within the past 15 years. A pack year of smoking is smoking an average of 1 pack of cigarettes a day for 1 year (for example: 1 pack a day for 30 years or 2 packs a day for 15 years). Yearly screening should continue until the smoker has stopped smoking for at least 15 years. Yearly screening should also be stopped for people who develop a health problem that would prevent them from having lung cancer treatment.  If you choose to drink alcohol, do not exceed 2 drinks per day. One drink is considered to be 12 ounces (  355 mL) of beer, 5 ounces (148 mL) of wine, or 1.5 ounces (44 mL) of liquor.  Avoid use of street drugs. Do not share needles with anyone. Ask for help if you need support or instructions about stopping the use of drugs.  High blood pressure causes heart disease and increases the risk of stroke. Your blood pressure should be checked at least every 1 to 2 years. Ongoing high blood pressure should be treated with medicines, if weight loss and  exercise are not effective.  If you are 49 to 51 years old, ask your caregiver if you should take aspirin to prevent heart disease.  Diabetes screening involves taking a blood sample to check your fasting blood sugar level. This should be done once every 3 years, after age 67, if you are within normal weight and without risk factors for diabetes. Testing should be considered at a younger age or be carried out more frequently if you are overweight and have at least 1 risk factor for diabetes.  Colorectal cancer can be detected and often prevented. Most routine colorectal cancer screening begins at the age of 72 and continues through age 25. However, your caregiver may recommend screening at an earlier age if you have risk factors for colon cancer. On a yearly basis, your caregiver may provide home test kits to check for hidden blood in the stool. Use of a small camera at the end of a tube, to directly examine the colon (sigmoidoscopy or colonoscopy), can detect the earliest forms of colorectal cancer. Talk to your caregiver about this at age 23, when routine screening begins. Direct examination of the colon should be repeated every 5 to 10 years through age 10, unless early forms of pre-cancerous polyps or small growths are found.  Hepatitis C blood testing is recommended for all people born from [birth year] through 1965 and any individual with known risks for hepatitis C.  Practice safe sex. Use condoms and avoid high-risk sexual practices to reduce the spread of sexually transmitted infections (STIs). STIs include gonorrhea, chlamydia, syphilis, trichomonas, herpes, HPV, and human immunodeficiency virus (HIV). Herpes, HIV, and HPV are viral illnesses that have no cure. They can result in disability, cancer, and death.  A one-time screening for abdominal aortic aneurysm (AAA) and surgical repair of large AAAs by sound wave imaging (ultrasonography) is recommended for ages 67 to 66 years who are current or  former smokers.  Healthy men should no longer receive prostate-specific antigen (PSA) blood tests as part of routine cancer screening. Consult with your caregiver about prostate cancer screening.  Testicular cancer screening is not recommended for adult males who have no symptoms. Screening includes self-exam, caregiver exam, and other screening tests. Consult with your caregiver about any symptoms you have or any concerns you have about testicular cancer.  Use sunscreen. Apply sunscreen liberally and repeatedly throughout the day. You should seek shade when your shadow is shorter than you. Protect yourself by wearing long sleeves, pants, a wide-brimmed hat, and sunglasses year round, whenever you are outdoors.  Once a month, do a whole body skin exam, using a mirror to look at the skin on your back. Notify your caregiver of new moles, moles that have irregular borders, moles that are larger than a pencil eraser, or moles that have changed in shape or color.  Stay current with required immunizations.  Influenza vaccine. All adults should be immunized every year.  Tetanus, diphtheria, and acellular pertussis (Td, Tdap) vaccine. An adult who has not previously  with required immunizations.  · Influenza vaccine. All adults should be immunized every year.  · Tetanus, diphtheria, and acellular pertussis (Td, Tdap) vaccine. An adult who has not previously received Tdap or who does not know his vaccine status should receive 1 dose of Tdap. This initial dose should be followed by tetanus and diphtheria toxoids (Td) booster doses every 10 years. Adults with an unknown or incomplete history of completing a 3-dose immunization series with Td-containing vaccines should begin or complete a primary immunization series including a Tdap dose. Adults should receive a Td booster every 10 years.  · Varicella vaccine. An adult without evidence of immunity to varicella should receive 2 doses or a second dose if he has previously received 1 dose.  · Human papillomavirus (HPV) vaccine. Males aged 13 21 years who have not received the vaccine previously should receive the 3-dose series. Males aged 22 26 years may be  immunized. Immunization is recommended through the age of 26 years for any male who has sex with males and did not get any or all doses earlier. Immunization is recommended for any person with an immunocompromised condition through the age of 26 years if he did not get any or all doses earlier. During the 3-dose series, the second dose should be obtained 4 8 weeks after the first dose. The third dose should be obtained 24 weeks after the first dose and 16 weeks after the second dose.  · Zoster vaccine. One dose is recommended for adults aged 60 years or older unless certain conditions are present.  · Measles, mumps, and rubella (MMR) vaccine. Adults born before 1957 generally are considered immune to measles and mumps. Adults born in 1957 or later should have 1 or more doses of MMR vaccine unless there is a contraindication to the vaccine or there is laboratory evidence of immunity to each of the three diseases. A routine second dose of MMR vaccine should be obtained at least 28 days after the first dose for students attending postsecondary schools, health care workers, or international travelers. People who received inactivated measles vaccine or an unknown type of measles vaccine during 1963 1967 should receive 2 doses of MMR vaccine. People who received inactivated mumps vaccine or an unknown type of mumps vaccine before 1979 and are at high risk for mumps infection should consider immunization with 2 doses of MMR vaccine. Unvaccinated health care workers born before 1957 who lack laboratory evidence of measles, mumps, or rubella immunity or laboratory confirmation of disease should consider measles and mumps immunization with 2 doses of MMR vaccine or rubella immunization with 1 dose of MMR vaccine.  · Pneumococcal 13-valent conjugate (PCV13) vaccine. When indicated, a person who is uncertain of his immunization history and has no record of immunization should receive the PCV13 vaccine. An adult aged 19 years or  older who has certain medical conditions and has not been previously immunized should receive 1 dose of PCV13 vaccine. This PCV13 should be followed with a dose of pneumococcal polysaccharide (PPSV23) vaccine. The PPSV23 vaccine dose should be obtained at least 8 weeks after the dose of PCV13 vaccine. An adult aged 19 years or older who has certain medical conditions and previously received 1 or more doses of PPSV23 vaccine should receive 1 dose of PCV13. The PCV13 vaccine dose should be obtained 1 or more years after the last PPSV23 vaccine dose.  · Pneumococcal polysaccharide (PPSV23) vaccine. When PCV13 is also indicated, PCV13 should be obtained first. All adults aged 65   years and older should be immunized. An adult younger than age 65 years who has certain medical conditions should be immunized. Any person who resides in a nursing home or long-term care facility should be immunized. An adult smoker should be immunized. People with an immunocompromised condition and certain other conditions should receive both PCV13 and PPSV23 vaccines. People with human immunodeficiency virus (HIV) infection should be immunized as soon as possible after diagnosis. Immunization during chemotherapy or radiation therapy should be avoided. Routine use of PPSV23 vaccine is not recommended for American Indians, [place] Natives, or people younger than 65 years unless there are medical conditions that require PPSV23 vaccine. When indicated, people who have unknown immunization and have no record of immunization should receive PPSV23 vaccine. One-time revaccination 5 years after the first dose of PPSV23 is recommended for people aged 19 64 years who have chronic kidney failure, nephrotic syndrome, asplenia, or immunocompromised conditions. People who received 1 2 doses of PPSV23 before age 65 years should receive another dose of PPSV23 vaccine at age 65 years or later if at least 5 years have passed since the previous dose. Doses of  PPSV23 are not needed for people immunized with PPSV23 at or after age 65 years.  · Meningococcal vaccine. Adults with asplenia or persistent complement component deficiencies should receive 2 doses of quadrivalent meningococcal conjugate (MenACWY-D) vaccine. The doses should be obtained at least 2 months apart. Microbiologists working with certain meningococcal bacteria, military recruits, people at risk during an outbreak, and people who travel to or live in countries with a high rate of meningitis should be immunized. A first-year college student up through age 21 years who is living in a residence hall should receive a dose if he did not receive a dose on or after his 16th birthday. Adults who have certain high-risk conditions should receive one or more doses of vaccine.  · Hepatitis A vaccine. Adults who wish to be protected from this disease, have certain high-risk conditions, work with hepatitis A-infected animals, work in hepatitis A research labs, or travel to or work in countries with a high rate of hepatitis A should be immunized. Adults who were previously unvaccinated and who anticipate close contact with an international adoptee during the first 60 days after arrival in the [place] from a country with a high rate of hepatitis A should be immunized.  · Hepatitis B vaccine. Adults who wish to be protected from this disease, have certain high-risk conditions, may be exposed to blood or other infectious body fluids, are household contacts or sex partners of hepatitis B positive people, are clients or workers in certain care facilities, or travel to or work in countries with a high rate of hepatitis B should be immunized.  · Haemophilus influenzae type b (Hib) vaccine. A previously unvaccinated person with asplenia or sickle cell disease or having a scheduled splenectomy should receive 1 dose of Hib vaccine. Regardless of previous immunization, a recipient of a hematopoietic stem cell transplant  should receive a 3-dose series 6 12 months after his successful transplant. Hib vaccine is not recommended for adults with HIV infection.  Preventive Service / Frequency  Ages 19 to 39  · Blood pressure check.** / Every 1 to 2 years.  · Lipid and cholesterol check.** / Every 5 years beginning at age 20.  · Hepatitis C blood test.** / For any individual with known risks for hepatitis C.  · Skin self-exam. / Monthly.  · Influenza vaccine. / Every year.  ·   need at least 1 dose of MMR if you were born in 1957 or later. You may also need a 2nd dose.  Pneumococcal 13-valent conjugate (PCV13) vaccine.** / Consult your caregiver.  Pneumococcal polysaccharide (PPSV23) vaccine.** / 1 to 2 doses if you smoke cigarettes or if you have certain conditions.  Meningococcal vaccine.** / 1 dose if you are age 30 to 15 years and a Market researcher living in a residence hall, or have one of several medical conditions, you need to get vaccinated against meningococcal disease. You may also need additional booster doses.  Hepatitis A vaccine.** / Consult your caregiver.  Hepatitis B vaccine.** / Consult your caregiver.  Haemophilus influenzae type b (Hib) vaccine.** / Consult your caregiver. Ages 36 to 12  Blood pressure check.** / Every 1 to 2 years.  Lipid and cholesterol check.** / Every 5 years beginning at age 71.  Lung cancer screening. / Every year if you are aged 55 80 years and have a 30-pack-year history of smoking and currently smoke or have quit within the past 15 years. Yearly screening is stopped once you have quit smoking for at least 15 years or develop a health problem that would prevent you from having lung cancer  treatment.  Fecal occult blood test (FOBT) of stool. / Every year beginning at age 75 and continuing until age 60. You may not have to do this test if you get colonoscopy every 10 years.  Flexible sigmoidoscopy** or colonoscopy.** / Every 5 years for a flexible sigmoidoscopy or every 10 years for a colonoscopy beginning at age 18 and continuing until age 14.  Hepatitis C blood test.** / For all people born from [birth year] through 1963 and any individual with known risks for hepatitis C.  Skin self-exam. / Monthly.  Influenza vaccine. / Every year.  Tetanus, diphtheria, and acellular pertussis (Tdap/Td) vaccine.** / Consult your caregiver. 1 dose of Td every 10 years.  Varicella vaccine.** / Consult your caregiver.  Zoster vaccine.** / 1 dose for adults aged 63 years or older.  Measles, mumps, rubella (MMR) vaccine.** / You need at least 1 dose of MMR if you were born in 1957 or later. You may also need a 2nd dose.  Pneumococcal 13-valent conjugate (PCV13) vaccine.** / Consult your caregiver.  Pneumococcal polysaccharide (PPSV23) vaccine.** / 1 to 2 doses if you smoke cigarettes or if you have certain conditions.  Meningococcal vaccine.** / Consult your caregiver.  Hepatitis A vaccine.** / Consult your caregiver.  Hepatitis B vaccine.** / Consult your caregiver.  Haemophilus influenzae type b (Hib) vaccine.** / Consult your caregiver. Ages 94 and over  Blood pressure check.** / Every 1 to 2 years.  Lipid and cholesterol check.**/ Every 5 years beginning at age 69.  Lung cancer screening. / Every year if you are aged 37 80 years and have a 30-pack-year history of smoking and currently smoke or have quit within the past 15 years. Yearly screening is stopped once you have quit smoking for at least 15 years or develop a health problem that would prevent you from having lung cancer treatment.  Fecal occult blood test (FOBT) of stool. / Every year beginning at age 73 and continuing until  age 56. You may not have to do this test if you get colonoscopy every 10 years.  Flexible sigmoidoscopy** or colonoscopy.** / Every 5 years for a flexible sigmoidoscopy or every 10 years for a colonoscopy beginning at age 40 and continuing until age 43.  Hepatitis C blood test.** /  For all people born from [birth year] through 1963 and any individual with known risks for hepatitis C.  Abdominal aortic aneurysm (AAA) screening.** / A one-time screening for ages 68 to 33 years who are current or former smokers.  Skin self-exam. / Monthly.  Influenza vaccine. / Every year.  Tetanus, diphtheria, and acellular pertussis (Tdap/Td) vaccine.** / 1 dose of Td every 10 years.  Varicella vaccine.** / Consult your caregiver.  Zoster vaccine.** / 1 dose for adults aged 61 years or older.  Pneumococcal 13-valent conjugate (PCV13) vaccine.** / Consult your caregiver.  Pneumococcal polysaccharide (PPSV23) vaccine.** / 1 dose for all adults aged 20 years and older.  Meningococcal vaccine.** / Consult your caregiver.  Hepatitis A vaccine.** / Consult your caregiver.  Hepatitis B vaccine.** / Consult your caregiver.  Haemophilus influenzae type b (Hib) vaccine.** / Consult your caregiver. **Family history and personal history of risk and conditions may change your caregiver's recommendations. Document Released: 07/28/2001 Document Revised: 09/26/2012 Document Reviewed: 10/27/2010 [facility] Patient Information 2014 [place], [place].

## 2013-06-24 LAB — PSA: PSA: 1.2 ng/mL (ref <=4.00)

## 2013-06-24 LAB — TSH: TSH: 1.577 u[IU]/mL (ref 0.350–4.500)

## 2013-06-25 ENCOUNTER — Encounter: Payer: Self-pay | Admitting: Family Medicine

## 2013-06-25 NOTE — Assessment & Plan Note (Signed)
History of recurrent episodes will refer to urology for consideration. PSA 1.2 today

## 2013-06-25 NOTE — Assessment & Plan Note (Signed)
Following with dentist, may try Aspercreme prn

## 2013-06-25 NOTE — Assessment & Plan Note (Signed)
No recent flares, may use OTC meds prn. Given Prevnar today

## 2013-06-25 NOTE — Progress Notes (Signed)
Patient ID: Marc Allen, male   DOB: 10/17/1960, 53 y.o.   MRN: [id number] Marc EvertsDavid F Allen [id number] 10/17/1960 06/25/2013      Progress Note-Follow Up  Subjective  Chief Complaint  Chief Complaint  Patient presents with  . Annual Exam    physical  . Injections    prevnar and flu    HPI  Patient is a 53 year old American male who is in poor. He is continuing to struggle with GI symptoms. Complaints of a history of hiatal hernia and reflux. Has noted some dark stool at times as well. Is moving his bowels routinely however. Denies fevers, chills, abdominal pain or other concerns. No recent illness, headache, chest pain or palpitations. No shortness or breath. Has had some mild urinary frequency recently. No dysuria or hematuria noted  Past Medical History  Diagnosis Date  . Hiatal hernia   . GERD (gastroesophageal reflux disease)     not currenlty on any meds  . Seasonal allergies   . Plantar fasciitis   . Flat feet   . TMJ syndrome     Past Surgical History  Procedure Laterality Date  . No past surgeries      denies surgical history    Family History  Problem Relation Age of Onset  . Alcohol abuse Paternal Grandfather   . Alcohol abuse Paternal Uncle   . Diabetes Paternal Uncle   . Hypertension Father   . Prostatitis Father   . Heart disease Father     fib  . Arthritis Father     DDD with neurologic complications  . Lung cancer Maternal Aunt     smoker  . Seizures Maternal Aunt     age 51  . Colon cancer Neg Hx   . Prostate cancer Neg Hx   . Autism Son     History   Social History  . Marital Status: Married    Spouse Name: N/A    Number of Children: N/A  . Years of Education: N/A   Occupational History  . Not on file.   Social History Main Topics  . Smoking status: Never Smoker   . Smokeless tobacco: Never Used  . Alcohol Use: No  . Drug Use: No  . Sexual Activity: Not on file   Other Topics Concern  . Not on file   Social History  Narrative   [employer]   Married x 15 years    No current outpatient prescriptions on file prior to visit.   No current facility-administered medications on file prior to visit.    No Known Allergies  Review of Systems  Review of Systems  Constitutional: Negative for fever, chills and malaise/fatigue.  HENT: Negative for congestion, hearing loss and nosebleeds.   Eyes: Negative for discharge.  Respiratory: Negative for cough, sputum production, shortness of breath and wheezing.   Cardiovascular: Negative for chest pain, palpitations and leg swelling.  Gastrointestinal: Positive for heartburn. Negative for nausea, vomiting, abdominal pain, diarrhea, constipation and blood in stool.  Genitourinary: Negative for dysuria, urgency, frequency and hematuria.  Musculoskeletal: Negative for back pain, falls and myalgias.  Skin: Negative for rash.  Neurological: Negative for dizziness, tremors, sensory change, focal weakness, loss of consciousness, weakness and headaches.  Endo/Heme/Allergies: Negative for polydipsia. Does not bruise/bleed easily.  Psychiatric/Behavioral: Negative for depression and suicidal ideas. The patient is not nervous/anxious and does not have insomnia.     Objective  BP 108/82  Pulse 54  Temp(Src) 98 F (36.7 C) (Oral)  Ht 5' 11.5" (1.816 m)  Wt 169 lb (76.658 kg)  BMI 23.24 kg/m2  SpO2 99%  Physical Exam  Physical Exam  Constitutional: He is oriented to person, place, and time and well-developed, well-nourished, and in no distress. No distress.  HENT:  Head: Normocephalic and atraumatic.  Eyes: Conjunctivae are normal.  Neck: Neck supple. No thyromegaly present.  Cardiovascular: Normal rate, regular rhythm and normal heart sounds.   No murmur Pizzo. Pulmonary/Chest: Effort normal and breath sounds normal. No respiratory distress.  Abdominal: Soft. Bowel sounds are normal. He exhibits no distension and no mass. There is no tenderness.   Musculoskeletal: He exhibits no edema.  Neurological: He is alert and oriented to person, place, and time.  Skin: Skin is warm.  Psychiatric: Memory, affect and judgment normal.    Lab Results  Component Value Date   TSH 1.577 06/23/2013   Lab Results  Component Value Date   WBC 5.1 06/23/2013   HGB 15.1 06/23/2013   HCT 43.5 06/23/2013   MCV 94.6 06/23/2013   PLT 252 06/23/2013   Lab Results  Component Value Date   CREATININE 0.84 06/23/2013   BUN 7 06/23/2013   NA 141 06/23/2013   K 4.3 06/23/2013   CL 105 06/23/2013   CO2 29 06/23/2013   Lab Results  Component Value Date   ALT 21 06/23/2013   AST 21 06/23/2013   ALKPHOS 58 06/23/2013   BILITOT 0.6 06/23/2013   Lab Results  Component Value Date   CHOL 165 06/23/2013   Lab Results  Component Value Date   HDL 52 06/23/2013   Lab Results  Component Value Date   LDLCALC 105* 06/23/2013   Lab Results  Component Value Date   TRIG 40 06/23/2013   Lab Results  Component Value Date   CHOLHDL 3.2 06/23/2013     Assessment & Plan  ALLERGIC RHINITIS, SEASONAL No recent flares, may use OTC meds prn. Given Prevnar today  TMJ syndrome Following with dentist, may try Aspercreme prn  GERD Encouraged probiotics and is due for screening colonoscopy as well so will refer to Gastroenterology for consideration. May use acid suppressing meds prn  Prostatitis History of recurrent episodes will refer to urology for consideration. PSA 1.2 today  Annual physical exam Encouraged heart healthy diet and regular exercise. Fasting labs obtained and reviewed. Patient given prevnar and referred for screening colonoscopy

## 2013-06-25 NOTE — Assessment & Plan Note (Signed)
Encouraged probiotics and is due for screening colonoscopy as well so will refer to Gastroenterology for consideration. May use acid suppressing meds prn

## 2013-06-25 NOTE — Assessment & Plan Note (Signed)
Encouraged heart healthy diet and regular exercise. Fasting labs obtained and reviewed. Patient given prevnar and referred for screening colonoscopy

## 2013-07-04 ENCOUNTER — Encounter: Payer: Self-pay | Admitting: Internal Medicine

## 2013-07-27 ENCOUNTER — Encounter: Payer: Self-pay | Admitting: *Deleted

## 2013-08-07 ENCOUNTER — Ambulatory Visit (INDEPENDENT_AMBULATORY_CARE_PROVIDER_SITE_OTHER): Payer: BC Managed Care – PPO | Admitting: Internal Medicine

## 2013-08-07 ENCOUNTER — Encounter: Payer: Self-pay | Admitting: Internal Medicine

## 2013-08-07 VITALS — BP 104/72 | HR 64 | Ht 71.5 in | Wt 170.2 lb

## 2013-08-07 DIAGNOSIS — K219 Gastro-esophageal reflux disease without esophagitis: Secondary | ICD-10-CM

## 2013-08-07 DIAGNOSIS — K449 Diaphragmatic hernia without obstruction or gangrene: Secondary | ICD-10-CM

## 2013-08-07 DIAGNOSIS — Z1211 Encounter for screening for malignant neoplasm of colon: Secondary | ICD-10-CM

## 2013-08-07 MED ORDER — NA SULFATE-K SULFATE-MG SULF 17.5-3.13-1.6 GM/177ML PO SOLN: ORAL | Status: DC

## 2013-08-07 MED ORDER — OMEPRAZOLE-SODIUM BICARBONATE 20-1100 MG PO CAPS: 1.0000 | ORAL_CAPSULE | Freq: Every day | ORAL | Status: DC | PRN

## 2013-08-07 NOTE — Patient Instructions (Signed)
You have been scheduled for an endoscopy and colonoscopy with propofol. Please follow the written instructions given to you at your visit today. Please pick up your prep at the pharmacy within the next 1-3 days. If you use inhalers (even only as needed), please bring them with you on the day of your procedure. Your physician has requested that you go to www.startemmi.com and enter the access code given to you at your visit today. This web site gives a general overview about your procedure. However, you should still follow specific instructions given to you by our office regarding your preparation for the procedure.  Use over the counter Zegerid per Dr. Leone PayorGessner.  I appreciate the opportunity to care for you.

## 2013-08-07 NOTE — Assessment & Plan Note (Signed)
Will schedule colonoscopy The risks and benefits as well as alternatives of endoscopic procedure(s) have been discussed and reviewed. All questions answered. The patient agrees to proceed.

## 2013-08-07 NOTE — Assessment & Plan Note (Signed)
Occasional sxs with diet and lifestyle changes 15 yr hx  1) prn Zegerid OTC to prevent heartburn 2) EGD to screen vor complications/barrett's 3) The risks and benefits as well as alternatives of endoscopic procedure(s) have been discussed and reviewed. All questions answered. The patient agrees to proceed.

## 2013-08-07 NOTE — Progress Notes (Signed)
Subjective:  Referred by: Bradd CanaryBlyth, Stacey A, MD   Patient ID: Marc Allen, male    DOB: May 28, 1961, 53 y.o.   MRN: [id number]  HPI Patient is a very nice 53 year old man, here to discuss acid reflux and also to discuss a screening colonoscopy. He has an approximately 15 year history of heartburn and indigestion and reflux symptoms. He had been on daily omeprazole for a number of years but over time wean himself off due to concerns about possible side effects and trying to avoid medication if at all possible. He is work on lifestyle changes including diet, and avoiding eating at least 3 hours before going to bed. Sometimes he is unable to avoid eating before going to bed and will get some reflux symptoms. He is not having any dysphagia. There is no bleeding or unintentional weight loss. He is concerned about the development of possible Barrett's esophagus.  He has not yet had a colonoscopy and desires a screening colonoscopy.  GI review of systems is otherwise negative. No Known Allergies No outpatient prescriptions prior to visit.   No facility-administered medications prior to visit.   Past Medical History  Diagnosis Date  . GERD (gastroesophageal reflux disease)     not currenlty on any meds  . Seasonal allergies   . Plantar fasciitis   . Flat feet   . TMJ syndrome    Past Surgical History  Procedure Laterality Date  . No past surgeries      denies surgical history   History   Social History  . Marital Status: Married    Spouse Name: N/A    Number of Children: N/A  . Years of Education: N/A   Social History Main Topics  . Smoking status: Never Smoker   . Smokeless tobacco: Never Used  . Alcohol Use: No  . Drug Use: No  . Sexual Activity: None   Other Topics Concern  . None   Social History Narrative   [employer] Driver   Married x 15 years   One son   2 caffeinated beverages daily   Updated as a 08/07/2013         Family History  Problem Relation  Age of Onset  . Alcohol abuse Paternal Grandfather   . Stroke Paternal Grandfather   . Alcohol abuse Paternal Uncle   . Diabetes Paternal Uncle   . Hypertension Father   . Prostatitis Father   . Atrial fibrillation Father   . Lung cancer Maternal Aunt     smoker  . Seizures Son     age 51  . Colon cancer Neg Hx   . Prostate cancer Neg Hx   . Autism Son   . Dementia Maternal Grandmother    Review of Systems Positive for those things mentioned in the history of present illness, all other review of systems are negative.    Objective:   Physical Exam General:  Well-developed, well-nourished and in no acute distress Eyes:  anicteric. ENT:   Mouth and posterior pharynx free of lesions.  Neck:   supple w/o thyromegaly or mass.  Lungs: Clear to auscultation bilaterally. Heart:  S1S2, no rubs, murmurs, gallops. Abdomen:  soft, non-tender, no hepatosplenomegaly, hernia, or mass and BS+.  Rectal: deferred Lymph:  no cervical or supraclavicular adenopathy. Extremities:   no edema Skin   no rash. Neuro:  A&O x 3.  Psych:  appropriate mood and  Affect.  Data Reviewed: Primary care note 06/23/2013 Labs from same date  in the EMR       Assessment & Plan:

## 2013-09-28 IMAGING — US US SCROTUM
1 series · 14 of 25 positions shown · non-contrast
Comparison: None

CLINICAL DATA: Left testicular pain, hematuria, evaluate for
torsion

SCROTAL ULTRASOUND
DOPPLER ULTRASOUND OF THE TESTICLES
TECHNIQUE: Complete ultrasound examination of the testicles,
epididymis, and other scrotal structures was performed.  Color and
spectral Doppler ultrasound were also utilized to evaluate blood
flow to the testicles.

[Series 1: us scrotum · 0.08mm/px · 14 of 38 slices shown]
[im 1/38]
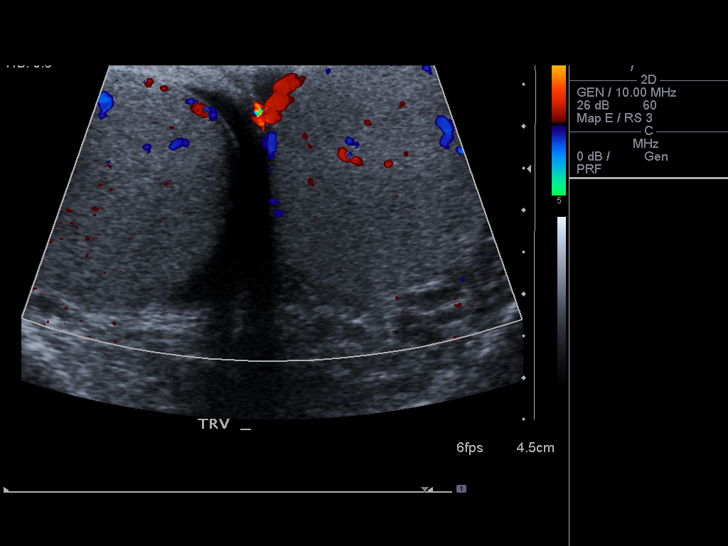
[im 4/38]
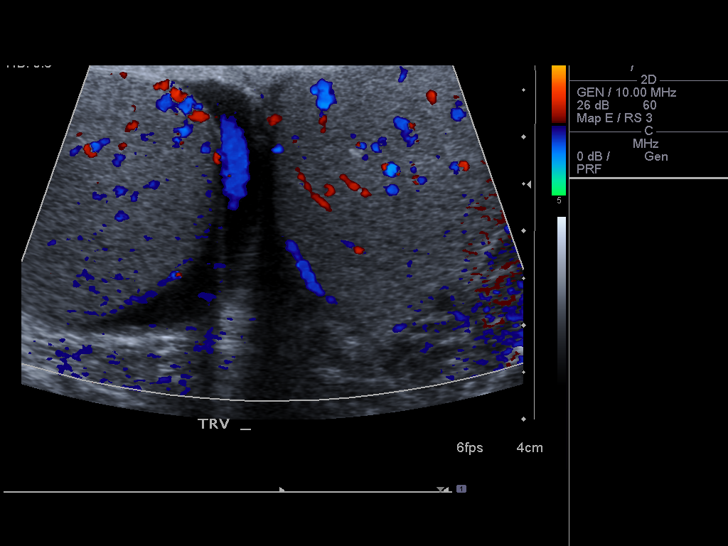
[im 7/38]
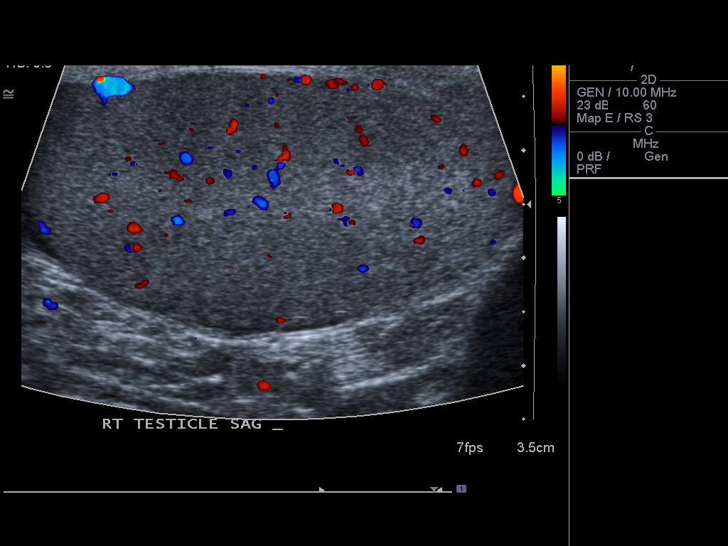
[im 10/38]
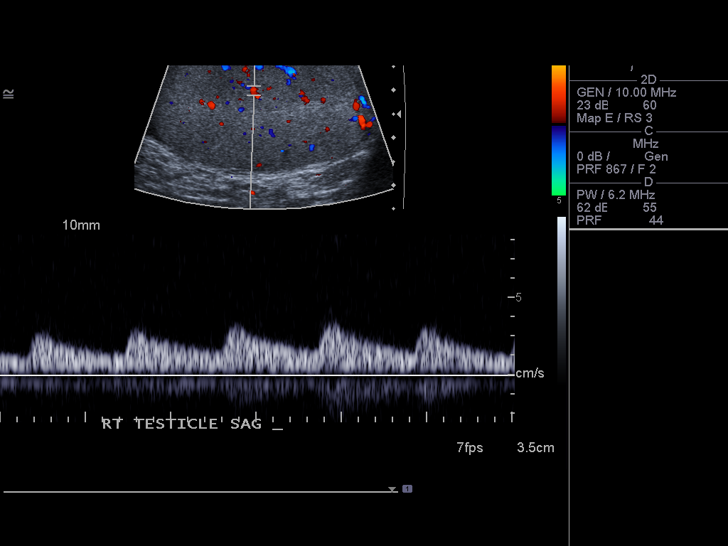
[im 13/38]
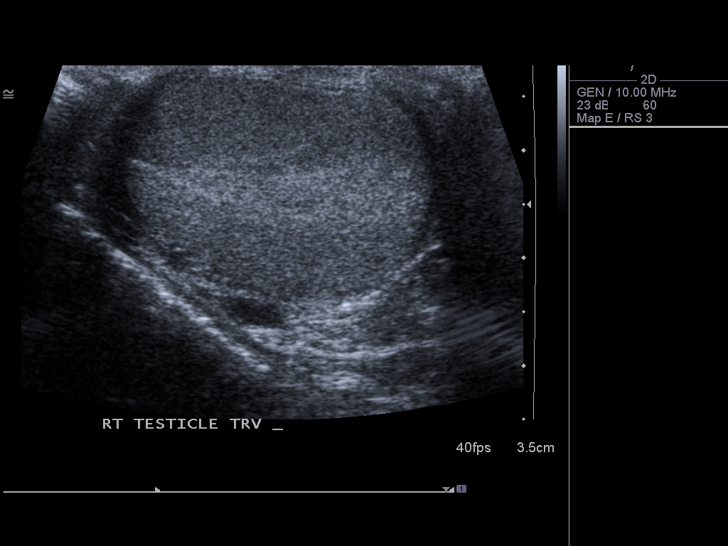
[im 14/38]
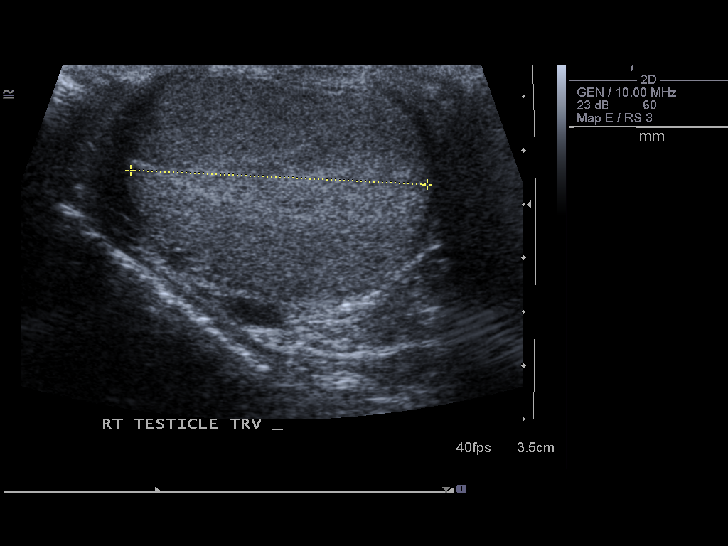
[im 17/38]
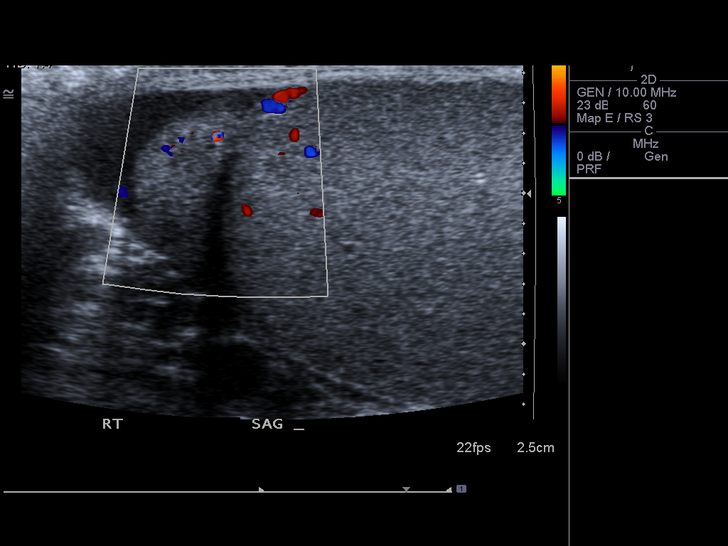
[im 21/38]
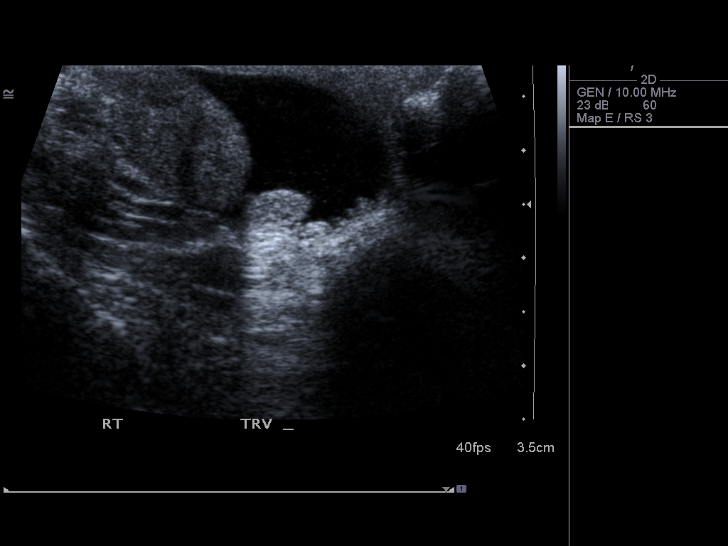
[im 24/38]
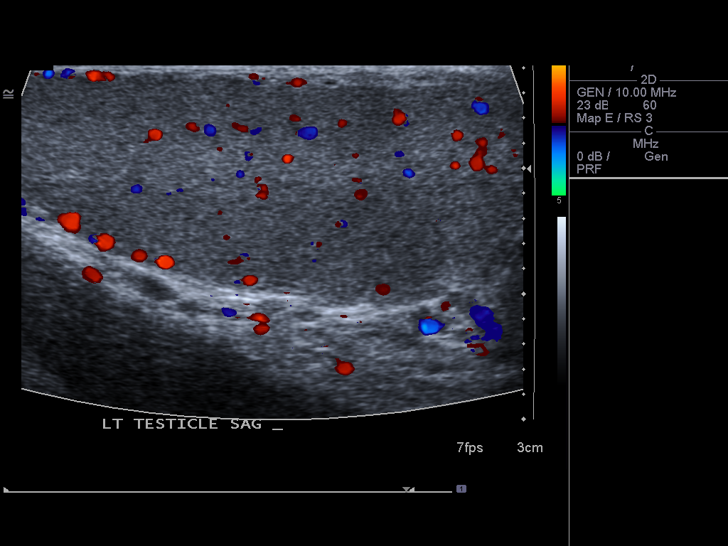
[im 25/38]
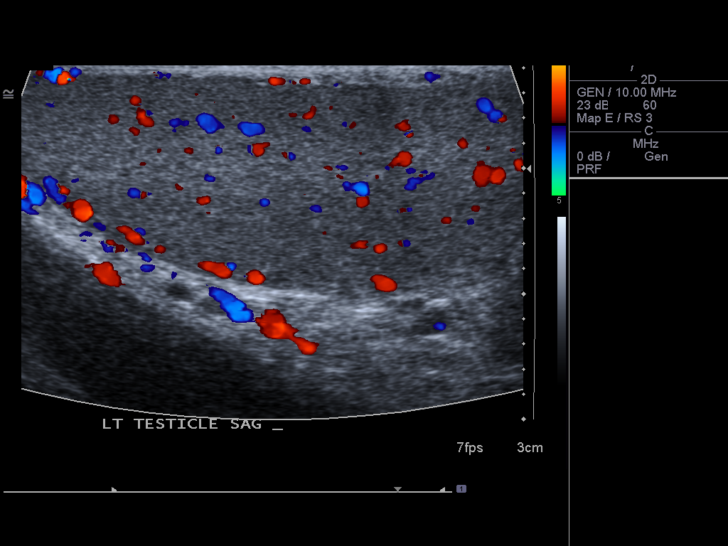
[im 28/38]
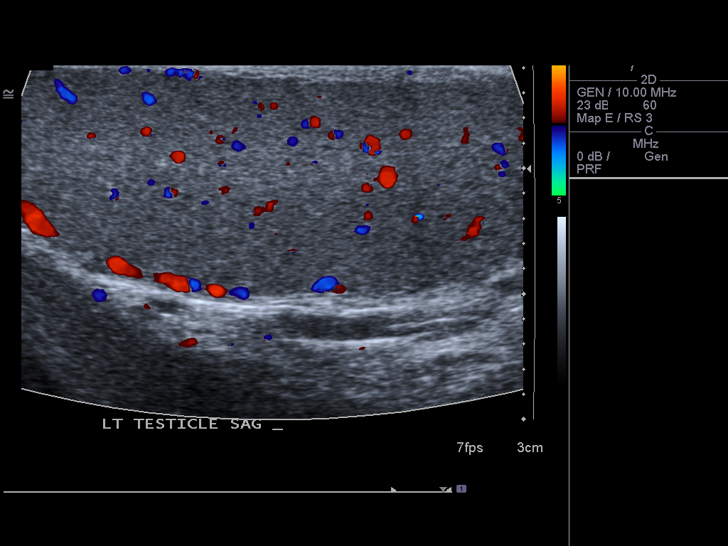
[im 31/38]
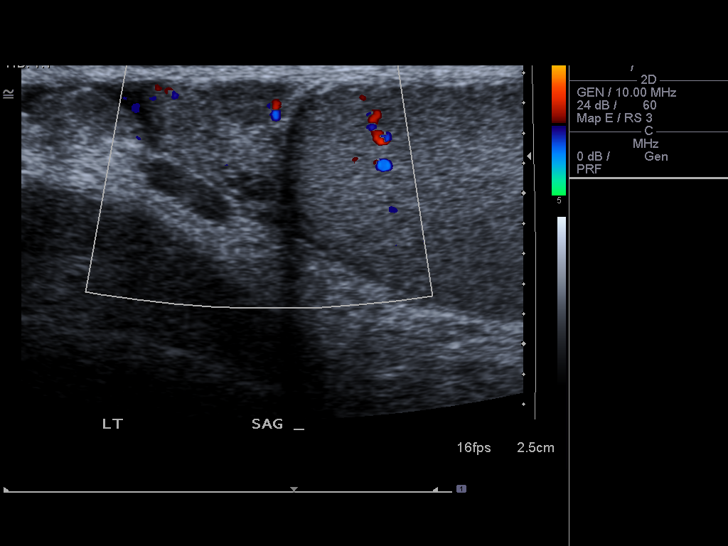
[im 34/38]
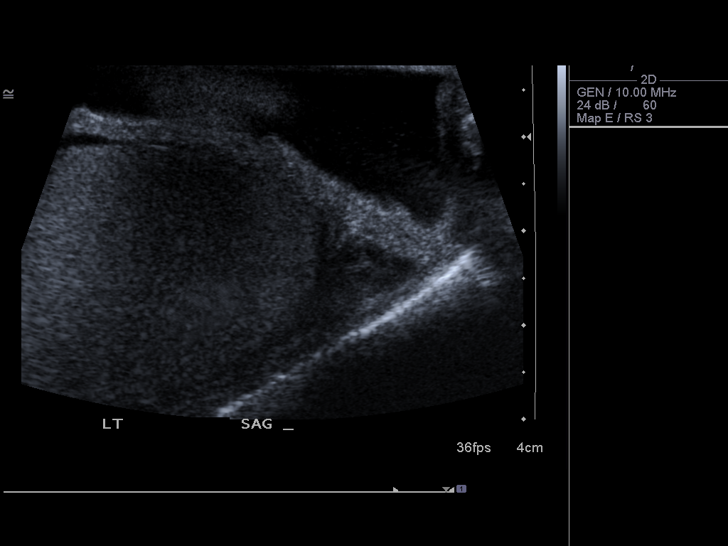
[im 38/38]
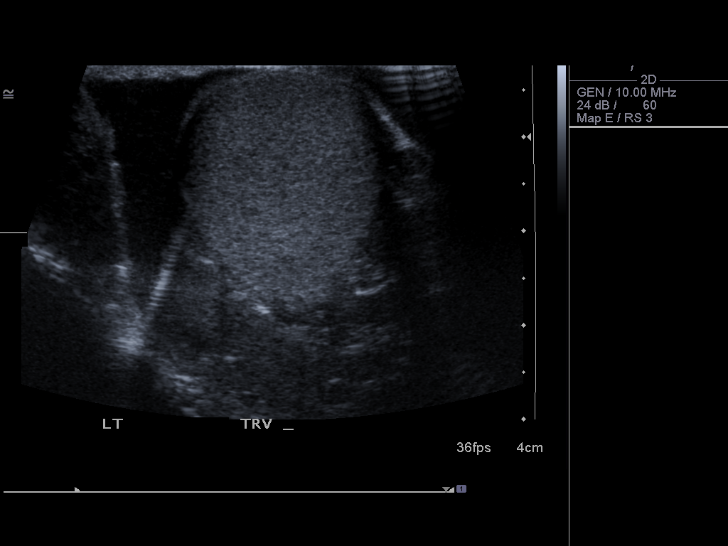

[14 of 25 positions shown; findings below may reference images not displayed]

FINDINGS: Right testis:  Normal in size and appearance, measuring 4.6 x 2.7 x
2.8 cm.

Left testis:  Normal in size., measuring 4.6 x 2.1 x 2.4 cm.

Right epididymis:  Normal in size and appearance.

Left epididymis:  Normal in size and appearance.

Hydocele:  Small hydroceles bilaterally.

Varicocele:  Absent.

Pulsed Doppler interrogation of both testes demonstrates low
resistance flow bilaterally.
IMPRESSION: Normal sonographic appearance of the bilateral testes.

No evidence of testicular torsion.

Small bilateral hydroceles.

## 2013-09-29 ENCOUNTER — Ambulatory Visit (AMBULATORY_SURGERY_CENTER): Payer: BC Managed Care – PPO | Admitting: Internal Medicine

## 2013-09-29 ENCOUNTER — Encounter: Payer: Self-pay | Admitting: Internal Medicine

## 2013-09-29 VITALS — BP 124/79 | HR 53 | Temp 97.8°F | Resp 24 | Ht 71.0 in | Wt 170.0 lb

## 2013-09-29 DIAGNOSIS — K5730 Diverticulosis of large intestine without perforation or abscess without bleeding: Secondary | ICD-10-CM

## 2013-09-29 DIAGNOSIS — K219 Gastro-esophageal reflux disease without esophagitis: Secondary | ICD-10-CM

## 2013-09-29 DIAGNOSIS — Z1211 Encounter for screening for malignant neoplasm of colon: Secondary | ICD-10-CM

## 2013-09-29 MED ORDER — SODIUM CHLORIDE 0.9 % IV SOLN: 500.0000 mL | INTRAVENOUS | Status: DC

## 2013-09-29 NOTE — Op Note (Signed)
[facility] [address] [place], [id number]   ENDOSCOPY PROCEDURE REPORT  PATIENT: Marc Allen, [name]  MR#: [id number] BIRTHDATE: [birth year] , 52  yrs. old GENDER: Male ENDOSCOPIST: Iva Booparl E Gessner, MD, Clementeen GrahamFACG REFERRED BY:  Reuel DerbyStacy Blyth, M.D. PROCEDURE DATE:  09/29/2013 PROCEDURE:  EGD, diagnostic ASA CLASS:     Class II INDICATIONS:  Heartburn. MEDICATIONS: propofol (Diprivan) 150mg  IV, MAC sedation, administered by CRNA, and These medications were titrated to patient response per physician's verbal order TOPICAL ANESTHETIC: Cetacaine Spray  DESCRIPTION OF PROCEDURE: After the risks benefits and alternatives of the procedure were thoroughly explained, informed consent was obtained.  The LB [id number] endoscope was introduced through the mouth and advanced to the second portion of the duodenum. Without limitations.  The instrument was slowly withdrawn as the mucosa was fully examined.      The upper, middle and distal third of the esophagus were carefully inspected and no abnormalities were noted.  The z-line was well seen at the GEJ.  The endoscope was pushed into the fundus which was normal including a retroflexed view.  The antrum, gastric body, first and second part of the duodenum were unremarkable. Retroflexed views revealed no abnormalities.     The scope was then withdrawn from the patient and the procedure completed.  COMPLICATIONS: There were no complications. ENDOSCOPIC IMPRESSION: Normal EGD  RECOMMENDATIONS: Treat heartburn as needed, no need to look again for Barrett's   eSigned:  Iva Booparl E Gessner, MD, [facility] 09/29/2013 1:59 PM   FA:OZHYQCC:Stacy Abner GreenspanBlyth, MD and The Patient

## 2013-09-29 NOTE — Progress Notes (Signed)
Report to pacu rn, vss, bbs=clear 

## 2013-09-29 NOTE — Op Note (Signed)
[facility] [address] [place], [id number]   COLONOSCOPY PROCEDURE REPORT  PATIENT: Marc EvertsHeard, Marc F.  MR#: [id number] BIRTHDATE: Sep 16, 1960 , 52  yrs. old GENDER: Male ENDOSCOPIST: Iva Booparl E Gessner, MD, [facility] REFERRED XB:JYNWGBY:Stacy Abner GreenspanBlyth, M.D. PROCEDURE DATE:  09/29/2013 PROCEDURE:   Colonoscopy, screening First Screening Colonoscopy - Avg.  risk and is 50 yrs.  old or older Yes.  Prior Negative Screening - Now for repeat screening. N/A  History of Adenoma - Now for follow-up colonoscopy & has been > or = to 3 yrs.  N/A  Polyps Removed Today? No.  Recommend repeat exam, <10 yrs? No. ASA CLASS:   Class II INDICATIONS:average risk screening and first colonoscopy. MEDICATIONS: Propofol (Diprivan) 120 mg IV, MAC sedation, administered by CRNA, These medications were titrated to patient response per physician's verbal order, and There was residual sedation effect present from prior procedure.  DESCRIPTION OF PROCEDURE:   After the risks benefits and alternatives of the procedure were thoroughly explained, informed consent was obtained.  A digital rectal exam revealed no abnormalities of the rectum, A digital rectal exam revealed no prostatic nodules, and A digital rectal exam revealed the prostate was not enlarged.   The LB [id number]  endoscope was introduced through the anus and advanced to the cecum, which was identified by both the appendix and ileocecal valve. No adverse events experienced.   The quality of the prep was excellent using Suprep  The instrument was then slowly withdrawn as the colon was fully examined.  COLON FINDINGS: Mild diverticulosis was noted throughout the entire examined colon.   The colon mucosa was otherwise normal.   A right colon retroflexion was performed.  Retroflexed views revealed no abnormalities. The time to cecum=1 minutes 57 seconds.  Withdrawal time=9 minutes 39 seconds.  The scope was withdrawn and the procedure  completed. COMPLICATIONS: There were no complications.  ENDOSCOPIC IMPRESSION: 1.   Mild diverticulosis was noted throughout the entire examined colon 2.   The colon mucosa was otherwise normal - excellent prep - first colonoscopy  RECOMMENDATIONS: Repeat colonoscopy 10 years.   eSigned:  Iva Booparl E Gessner, MD, Clementeen GrahamFACG 09/29/2013 2:02 PM   cc: Reuel DerbyStacy Blyth, MD and The Patient

## 2013-09-29 NOTE — Patient Instructions (Addendum)
The upper endoscopy was normal. The colonoscopy revealed some diverticulosis but no polyps or cancer. Diverticulosis is common and not usually a problem. Please read the handout provided.  OK to treat heartburn as needed.  Next routine colonoscopy in 10 years - 2025  I appreciate the opportunity to care for you. Iva Booparl E. Niajah Sipos, MD, FACG  YOU HAD AN ENDOSCOPIC PROCEDURE TODAY AT THE [facility]: Refer to the procedure report that was given to you for any specific questions about what was found during the examination.  If the procedure report does not answer your questions, please call your gastroenterologist to clarify.  If you requested that your care partner not be given the details of your procedure findings, then the procedure report has been included in a sealed envelope for you to review at your convenience later.  YOU SHOULD EXPECT: Some feelings of bloating in the abdomen. Passage of more gas than usual.  Walking can help get rid of the air that was put into your GI tract during the procedure and reduce the bloating. If you had a lower endoscopy (such as a colonoscopy or flexible sigmoidoscopy) you may notice spotting of blood in your stool or on the toilet paper. If you underwent a bowel prep for your procedure, then you may not have a normal bowel movement for a few days.  DIET: Your first meal following the procedure should be a light meal and then it is ok to progress to your normal diet.  A half-sandwich or bowl of soup is an example of a good first meal.  Heavy or fried foods are harder to digest and may make you feel nauseous or bloated.  Likewise meals heavy in dairy and vegetables can cause extra gas to form and this can also increase the bloating.  Drink plenty of fluids but you should avoid alcoholic beverages for 24 hours.  ACTIVITY: Your care partner should take you home directly after the procedure.  You should plan to take it easy, moving slowly for the rest of  the day.  You can resume normal activity the day after the procedure however you should NOT DRIVE or use heavy machinery for 24 hours (because of the sedation medicines used during the test).    SYMPTOMS TO REPORT IMMEDIATELY: A gastroenterologist can be reached at any hour.  During normal business hours, 8:30 AM to 5:00 PM Monday through Friday, call [phone].  After hours and on weekends, please call the GI answering service at [phone] who will take a message and have the physician on call contact you.   Following lower endoscopy (colonoscopy or flexible sigmoidoscopy):  Excessive amounts of blood in the stool  Significant tenderness or worsening of abdominal pains  Swelling of the abdomen that is new, acute  Fever of 100F or higher  Following upper endoscopy (EGD)  Vomiting of blood or coffee ground material  New chest pain or pain under the shoulder blades  Painful or persistently difficult swallowing  New shortness of breath  Fever of 100F or higher  Black, tarry-looking stools  FOLLOW UP: If any biopsies were taken you will be contacted by phone or by letter within the next 1-3 weeks.  Call your gastroenterologist if you have not [place] about the biopsies in 3 weeks.  Our staff will call the home number listed on your records the next business day following your procedure to check on you and address any questions or concerns that you may have at that  time regarding the information given to you following your procedure. This is a courtesy call and so if there is no answer at the home number and we have not [place] from you through the emergency physician on call, we will assume that you have returned to your regular daily activities without incident.  SIGNATURES/CONFIDENTIALITY: You and/or your care partner have signed paperwork which will be entered into your electronic medical record.  These signatures attest to the fact that that the information above on your After Visit  Summary has been reviewed and is understood.  Full responsibility of the confidentiality of this discharge information lies with you and/or your care-partner.YOU HAD AN ENDOSCOPIC PROCEDURE TODAY AT THE [facility]: Refer to the procedure report that was given to you for any specific questions about what was found during the examination.  If the procedure report does not answer your questions, please call your gastroenterologist to clarify.  If you requested that your care partner not be given the details of your procedure findings, then the procedure report has been included in a sealed envelope for you to review at your convenience later.  YOU SHOULD EXPECT: Some feelings of bloating in the abdomen. Passage of more gas than usual.  Walking can help get rid of the air that was put into your GI tract during the procedure and reduce the bloating. If you had a lower endoscopy (such as a colonoscopy or flexible sigmoidoscopy) you may notice spotting of blood in your stool or on the toilet paper. If you underwent a bowel prep for your procedure, then you may not have a normal bowel movement for a few days.  DIET: Your first meal following the procedure should be a light meal and then it is ok to progress to your normal diet.  A half-sandwich or bowl of soup is an example of a good first meal.  Heavy or fried foods are harder to digest and may make you feel nauseous or bloated.  Likewise meals heavy in dairy and vegetables can cause extra gas to form and this can also increase the bloating.  Drink plenty of fluids but you should avoid alcoholic beverages for 24 hours.  ACTIVITY: Your care partner should take you home directly after the procedure.  You should plan to take it easy, moving slowly for the rest of the day.  You can resume normal activity the day after the procedure however you should NOT DRIVE or use heavy machinery for 24 hours (because of the sedation medicines used during the test).     SYMPTOMS TO REPORT IMMEDIATELY: A gastroenterologist can be reached at any hour.  During normal business hours, 8:30 AM to 5:00 PM Monday through Friday, call [phone].  After hours and on weekends, please call the GI answering service at [phone] who will take a message and have the physician on call contact you.   Following lower endoscopy (colonoscopy or flexible sigmoidoscopy):  Excessive amounts of blood in the stool  Significant tenderness or worsening of abdominal pains  Swelling of the abdomen that is new, acute  Fever of 100F or higher  Following upper endoscopy (EGD)  Vomiting of blood or coffee ground material  New chest pain or pain under the shoulder blades  Painful or persistently difficult swallowing  New shortness of breath  Fever of 100F or higher  Black, tarry-looking stools  FOLLOW UP: If any biopsies were taken you will be contacted by phone or by letter within the next  1-3 weeks.  Call your gastroenterologist if you have not [place] about the biopsies in 3 weeks.  Our staff will call the home number listed on your records the next business day following your procedure to check on you and address any questions or concerns that you may have at that time regarding the information given to you following your procedure. This is a courtesy call and so if there is no answer at the home number and we have not [place] from you through the emergency physician on call, we will assume that you have returned to your regular daily activities without incident.  SIGNATURES/CONFIDENTIALITY: You and/or your care partner have signed paperwork which will be entered into your electronic medical record.  These signatures attest to the fact that that the information above on your After Visit Summary has been reviewed and is understood.  Full responsibility of the confidentiality of this discharge information lies with you and/or your care-partner.  Diverticulosis information  given.

## 2013-10-02 ENCOUNTER — Telehealth: Payer: Self-pay | Admitting: *Deleted

## 2013-10-02 NOTE — Telephone Encounter (Signed)
  Follow up Call-  Call back number 09/29/2013  Post procedure Call Back phone  # [phone]  Permission to leave phone message Yes     Patient questions:  Do you have a fever, pain , or abdominal swelling? no Pain Score  0 *  Have you tolerated food without any problems? yes  Have you been able to return to your normal activities? yes  Do you have any questions about your discharge instructions: Diet   no Medications  no Follow up visit  no  Do you have questions or concerns about your Care? no  Actions: * If pain score is 4 or above: No action needed, pain <4.

## 2014-01-10 ENCOUNTER — Ambulatory Visit (INDEPENDENT_AMBULATORY_CARE_PROVIDER_SITE_OTHER): Payer: BC Managed Care – PPO | Admitting: Physician Assistant

## 2014-01-10 ENCOUNTER — Encounter: Payer: Self-pay | Admitting: Physician Assistant

## 2014-01-10 VITALS — BP 104/88 | HR 60 | Temp 97.9°F | Resp 16 | Ht 71.0 in | Wt 169.5 lb

## 2014-01-10 DIAGNOSIS — R3989 Other symptoms and signs involving the genitourinary system: Secondary | ICD-10-CM

## 2014-01-10 DIAGNOSIS — R399 Unspecified symptoms and signs involving the genitourinary system: Secondary | ICD-10-CM | POA: Insufficient documentation

## 2014-01-10 DIAGNOSIS — R300 Dysuria: Secondary | ICD-10-CM

## 2014-01-10 LAB — POCT URINALYSIS DIPSTICK
GLUCOSE UA: NEGATIVE
Leukocytes, UA: NEGATIVE
Nitrite, UA: NEGATIVE
RBC UA: NEGATIVE
Spec Grav, UA: 1.015
Urobilinogen, UA: 0.2
pH, UA: 5

## 2014-01-10 NOTE — Progress Notes (Signed)
Pre visit review using our clinic review tool, if applicable. No additional management support is needed unless otherwise documented below in the visit note/SLS  

## 2014-01-10 NOTE — Assessment & Plan Note (Signed)
Resolved for 1.5 days.  Urine doip unremarkable.  Will send for culture.  Symptoms could have been developing UTI secondary to intercourse that the patient cleared on his own.  Encourage increased fluids, cranberry supplement and probiotic.  If indicated by culture, will treat with ABX.  Patient to call if symptoms recur or new symptoms develop.

## 2014-01-10 NOTE — Patient Instructions (Signed)
Increase fluid intake.  Drink cranberry juice.  Take a daily probiotic.  It is likely you have cleared the infection on your own before it became full blown.  I will call you with your results.  If any symptoms were to recur, please give me a call.

## 2014-01-10 NOTE — Progress Notes (Signed)
Patient presents to clinic today c/o dysuria and urgency that occurred 3 days ago. Denies fever, chills, N/V, hematuria. Endorses recent intercourse with his wife.  Denies pain, discharge or concern for STI. Patient endorses history of chronic prostatitis was recently referred to Urology for further assessment.  States this felt different.  Symptoms have been completely resolved for 1.5 days.   Past Medical History  Diagnosis Date  . GERD (gastroesophageal reflux disease)     not currenlty on any meds  . Seasonal allergies   . Plantar fasciitis   . Flat feet   . TMJ syndrome     Current Outpatient Prescriptions on File Prior to Visit  Medication Sig Dispense Refill  . Omeprazole-Sodium Bicarbonate (ZEGERID OTC) 20-1100 MG CAPS capsule Take 1 capsule by mouth daily as needed (to prevent heartburn).  28 each  0  . tamsulosin (FLOMAX) 0.4 MG CAPS capsule Take 0.4 mg by mouth daily as needed.        No current facility-administered medications on file prior to visit.    No Known Allergies  Family History  Problem Relation Age of Onset  . Alcohol abuse Paternal Grandfather   . Stroke Paternal Grandfather   . Alcohol abuse Paternal Uncle   . Diabetes Paternal Uncle   . Hypertension Father   . Prostatitis Father   . Atrial fibrillation Father   . Lung cancer Maternal Aunt     smoker  . Seizures Son     age 51  . Colon cancer Neg Hx   . Prostate cancer Neg Hx   . Autism Son   . Dementia Maternal Grandmother     History   Social History  . Marital Status: Married    Spouse Name: N/A    Number of Children: N/A  . Years of Education: N/A   Social History Main Topics  . Smoking status: Never Smoker   . Smokeless tobacco: Never Used  . Alcohol Use: No  . Drug Use: No  . Sexual Activity: None   Other Topics Concern  . None   Social History Narrative   [employer]   Married x 15 years   One son   2 caffeinated beverages daily   Updated as a 08/07/2013          Review of Systems - See HPI.  All other ROS are negative.  BP 104/88  Pulse 60  Temp(Src) 97.9 F (36.6 C) (Oral)  Resp 16  Ht 5\' 11"  (1.803 m)  Wt 169 lb 8 oz (76.885 kg)  BMI 23.65 kg/m2  SpO2 99%  Physical Exam  Vitals reviewed. Constitutional: He is well-developed, well-nourished, and in no distress.  Cardiovascular: Normal rate, regular rhythm, normal heart sounds and intact distal pulses.   Pulmonary/Chest: Effort normal and breath sounds normal. No respiratory distress. He has no wheezes. He has no rales. He exhibits no tenderness.  Abdominal: Soft. Bowel sounds are normal. There is no tenderness.  Negative CVA tenderness.  Genitourinary: Penis normal.  Skin: Skin is warm and dry. No rash noted.   Assessment/Plan: UTI symptoms Resolved for 1.5 days.  Urine doip unremarkable.  Will send for culture.  Symptoms could have been developing UTI secondary to intercourse that the patient cleared on his own.  Encourage increased fluids, cranberry supplement and probiotic.  If indicated by culture, will treat with ABX.  Patient to call if symptoms recur or new symptoms develop.

## 2014-01-11 LAB — CULTURE, URINE COMPREHENSIVE
Colony Count: NO GROWTH
ORGANISM ID, BACTERIA: NO GROWTH

## 2014-06-25 ENCOUNTER — Encounter: Payer: Self-pay | Admitting: Family Medicine

## 2014-06-25 ENCOUNTER — Ambulatory Visit (INDEPENDENT_AMBULATORY_CARE_PROVIDER_SITE_OTHER): Payer: BLUE CROSS/BLUE SHIELD | Admitting: Family Medicine

## 2014-06-25 ENCOUNTER — Ambulatory Visit (INDEPENDENT_AMBULATORY_CARE_PROVIDER_SITE_OTHER): Payer: BLUE CROSS/BLUE SHIELD | Admitting: *Deleted

## 2014-06-25 VITALS — BP 118/78 | HR 50 | Temp 98.0°F | Ht 72.0 in | Wt 169.0 lb

## 2014-06-25 DIAGNOSIS — Z23 Encounter for immunization: Secondary | ICD-10-CM

## 2014-06-25 DIAGNOSIS — K219 Gastro-esophageal reflux disease without esophagitis: Secondary | ICD-10-CM

## 2014-06-25 DIAGNOSIS — K449 Diaphragmatic hernia without obstruction or gangrene: Secondary | ICD-10-CM

## 2014-06-25 DIAGNOSIS — Z0000 Encounter for general adult medical examination without abnormal findings: Secondary | ICD-10-CM

## 2014-06-25 NOTE — Patient Instructions (Addendum)
Check with insurance to see if they pay for the Tetanus, diptheria and pertussis (TDaP) last Td was in 2009 Ask if they will pay for an Acute Hepatitis Panel   Probiotic daily such [employer] or [facility] Colon health daily Fiber such as Psyllium/Metamucil mixed in 8 oz of fluids daily 64 oz of clear daily Salon [place] to shoulder twice daily   Preventive Care for Adults A healthy lifestyle and preventive care can promote health and wellness. Preventive health guidelines for men include the following key practices:  A routine yearly physical is a good way to check with your health care provider about your health and preventative screening. It is a chance to share any concerns and updates on your health and to receive a thorough exam.  Visit your dentist for a routine exam and preventative care every 6 months. Brush your teeth twice a day and floss once a day. Good oral hygiene prevents tooth decay and gum disease.  The frequency of eye exams is based on your age, health, family medical history, use of contact lenses, and other factors. Follow your health care provider's recommendations for frequency of eye exams.  Eat a healthy diet. Foods such as vegetables, fruits, whole grains, low-fat dairy products, and lean protein foods contain the nutrients you need without too many calories. Decrease your intake of foods high in solid fats, added sugars, and salt. Eat the right amount of calories for you.Get information about a proper diet from your health care provider, if necessary.  Regular physical exercise is one of the most important things you can do for your health. Most adults should get at least 150 minutes of moderate-intensity exercise (any activity that increases your heart rate and causes you to sweat) each week. In addition, most adults need muscle-strengthening exercises on 2 or more days a week.  Maintain a healthy weight. The body mass index (BMI) is a screening tool to  identify possible weight problems. It provides an estimate of body fat based on height and weight. Your health care provider can find your BMI and can help you achieve or maintain a healthy weight.For adults 20 years and older:  A BMI below 18.5 is considered underweight.  A BMI of 18.5 to 24.9 is normal.  A BMI of 25 to 29.9 is considered overweight.  A BMI of 30 and above is considered obese.  Maintain normal blood lipids and cholesterol levels by exercising and minimizing your intake of saturated fat. Eat a balanced diet with plenty of fruit and vegetables. Blood tests for lipids and cholesterol should begin at age 51 and be repeated every 5 years. If your lipid or cholesterol levels are high, you are over 50, or you are at high risk for heart disease, you may need your cholesterol levels checked more frequently.Ongoing high lipid and cholesterol levels should be treated with medicines if diet and exercise are not working.  If you smoke, find out from your health care provider how to quit. If you do not use tobacco, do not start.  Lung cancer screening is recommended for adults aged 61-80 years who are at high risk for developing lung cancer because of a history of smoking. A yearly low-dose CT scan of the lungs is recommended for people who have at least a 30-pack-year history of smoking and are a current smoker or have quit within the past 15 years. A pack year of smoking is smoking an average of 1 pack of cigarettes a day for  1 year (for example: 1 pack a day for 30 years or 2 packs a day for 15 years). Yearly screening should continue until the smoker has stopped smoking for at least 15 years. Yearly screening should be stopped for people who develop a health problem that would prevent them from having lung cancer treatment.  If you choose to drink alcohol, do not have more than 2 drinks per day. One drink is considered to be 12 ounces (355 mL) of beer, 5 ounces (148 mL) of wine, or 1.5  ounces (44 mL) of liquor.  Avoid use of street drugs. Do not share needles with anyone. Ask for help if you need support or instructions about stopping the use of drugs.  High blood pressure causes heart disease and increases the risk of stroke. Your blood pressure should be checked at least every 1-2 years. Ongoing high blood pressure should be treated with medicines, if weight loss and exercise are not effective.  If you are 77-63 years old, ask your health care provider if you should take aspirin to prevent heart disease.  Diabetes screening involves taking a blood sample to check your fasting blood sugar level. This should be done once every 3 years, after age 20, if you are within normal weight and without risk factors for diabetes. Testing should be considered at a younger age or be carried out more frequently if you are overweight and have at least 1 risk factor for diabetes.  Colorectal cancer can be detected and often prevented. Most routine colorectal cancer screening begins at the age of 28 and continues through age 29. However, your health care provider may recommend screening at an earlier age if you have risk factors for colon cancer. On a yearly basis, your health care provider may provide home test kits to check for hidden blood in the stool. Use of a small camera at the end of a tube to directly examine the colon (sigmoidoscopy or colonoscopy) can detect the earliest forms of colorectal cancer. Talk to your health care provider about this at age 54, when routine screening begins. Direct exam of the colon should be repeated every 5-10 years through age 43, unless early forms of precancerous polyps or small growths are found.  People who are at an increased risk for hepatitis B should be screened for this virus. You are considered at high risk for hepatitis B if:  You were born in a country where hepatitis B occurs often. Talk with your health care provider about which countries are  considered high risk.  Your parents were born in a high-risk country and you have not received a shot to protect against hepatitis B (hepatitis B vaccine).  You have HIV or AIDS.  You use needles to inject street drugs.  You live with, or have sex with, someone who has hepatitis B.  You are a man who has sex with other men (MSM).  You get hemodialysis treatment.  You take certain medicines for conditions such as cancer, organ transplantation, and autoimmune conditions.  Hepatitis C blood testing is recommended for all people born from [birth year] through 1965 and any individual with known risks for hepatitis C.  Practice safe sex. Use condoms and avoid high-risk sexual practices to reduce the spread of sexually transmitted infections (STIs). STIs include gonorrhea, chlamydia, syphilis, trichomonas, herpes, HPV, and human immunodeficiency virus (HIV). Herpes, HIV, and HPV are viral illnesses that have no cure. They can result in disability, cancer, and death.  If you are  at risk of being infected with HIV, it is recommended that you take a prescription medicine daily to prevent HIV infection. This is called preexposure prophylaxis (PrEP). You are considered at risk if:  You are a man who has sex with other men (MSM) and have other risk factors.  You are a heterosexual man, are sexually active, and are at increased risk for HIV infection.  You take drugs by injection.  You are sexually active with a partner who has HIV.  Talk with your health care provider about whether you are at high risk of being infected with HIV. If you choose to begin PrEP, you should first be tested for HIV. You should then be tested every 3 months for as long as you are taking PrEP.  A one-time screening for abdominal aortic aneurysm (AAA) and surgical repair of large AAAs by ultrasound are recommended for men ages 54 to 64 years who are current or former smokers.  Healthy men should no longer receive  prostate-specific antigen (PSA) blood tests as part of routine cancer screening. Talk with your health care provider about prostate cancer screening.  Testicular cancer screening is not recommended for adult males who have no symptoms. Screening includes self-exam, a health care provider exam, and other screening tests. Consult with your health care provider about any symptoms you have or any concerns you have about testicular cancer.  Use sunscreen. Apply sunscreen liberally and repeatedly throughout the day. You should seek shade when your shadow is shorter than you. Protect yourself by wearing long sleeves, pants, a wide-brimmed hat, and sunglasses year round, whenever you are outdoors.  Once a month, do a whole-body skin exam, using a mirror to look at the skin on your back. Tell your health care provider about new moles, moles that have irregular borders, moles that are larger than a pencil eraser, or moles that have changed in shape or color.  Stay current with required vaccines (immunizations).  Influenza vaccine. All adults should be immunized every year.  Tetanus, diphtheria, and acellular pertussis (Td, Tdap) vaccine. An adult who has not previously received Tdap or who does not know his vaccine status should receive 1 dose of Tdap. This initial dose should be followed by tetanus and diphtheria toxoids (Td) booster doses every 10 years. Adults with an unknown or incomplete history of completing a 3-dose immunization series with Td-containing vaccines should begin or complete a primary immunization series including a Tdap dose. Adults should receive a Td booster every 10 years.  Varicella vaccine. An adult without evidence of immunity to varicella should receive 2 doses or a second dose if he has previously received 1 dose.  Human papillomavirus (HPV) vaccine. Males aged 78-21 years who have not received the vaccine previously should receive the 3-dose series. Males aged 22-26 years may be  immunized. Immunization is recommended through the age of 21 years for any male who has sex with males and did not get any or all doses earlier. Immunization is recommended for any person with an immunocompromised condition through the age of 51 years if he did not get any or all doses earlier. During the 3-dose series, the second dose should be obtained 4-8 weeks after the first dose. The third dose should be obtained 24 weeks after the first dose and 16 weeks after the second dose.  Zoster vaccine. One dose is recommended for adults aged 34 years or older unless certain conditions are present.  Measles, mumps, and rubella (MMR) vaccine. Adults born  before 1957 generally are considered immune to measles and mumps. Adults born in [birth year] or later should have 1 or more doses of MMR vaccine unless there is a contraindication to the vaccine or there is laboratory evidence of immunity to each of the three diseases. A routine second dose of MMR vaccine should be obtained at least 28 days after the first dose for students attending postsecondary schools, health care workers, or international travelers. People who received inactivated measles vaccine or an unknown type of measles vaccine during 1963-1967 should receive 2 doses of MMR vaccine. People who received inactivated mumps vaccine or an unknown type of mumps vaccine before 1979 and are at high risk for mumps infection should consider immunization with 2 doses of MMR vaccine. Unvaccinated health care workers born before [birth year] who lack laboratory evidence of measles, mumps, or rubella immunity or laboratory confirmation of disease should consider measles and mumps immunization with 2 doses of MMR vaccine or rubella immunization with 1 dose of MMR vaccine.  Pneumococcal 13-valent conjugate (PCV13) vaccine. When indicated, a person who is uncertain of his immunization history and has no record of immunization should receive the PCV13 vaccine. An adult aged 56 years or  older who has certain medical conditions and has not been previously immunized should receive 1 dose of PCV13 vaccine. This PCV13 should be followed with a dose of pneumococcal polysaccharide (PPSV23) vaccine. The PPSV23 vaccine dose should be obtained at least 8 weeks after the dose of PCV13 vaccine. An adult aged 101 years or older who has certain medical conditions and previously received 1 or more doses of PPSV23 vaccine should receive 1 dose of PCV13. The PCV13 vaccine dose should be obtained 1 or more years after the last PPSV23 vaccine dose.  Pneumococcal polysaccharide (PPSV23) vaccine. When PCV13 is also indicated, PCV13 should be obtained first. All adults aged 38 years and older should be immunized. An adult younger than age 79 years who has certain medical conditions should be immunized. Any person who resides in a nursing home or long-term care facility should be immunized. An adult smoker should be immunized. People with an immunocompromised condition and certain other conditions should receive both PCV13 and PPSV23 vaccines. People with human immunodeficiency virus (HIV) infection should be immunized as soon as possible after diagnosis. Immunization during chemotherapy or radiation therapy should be avoided. Routine use of PPSV23 vaccine is not recommended for American Indians, [place]atives, or people younger than 65 years unless there are medical conditions that require PPSV23 vaccine. When indicated, people who have unknown immunization and have no record of immunization should receive PPSV23 vaccine. One-time revaccination 5 years after the first dose of PPSV23 is recommended for people aged 19-64 years who have chronic kidney failure, nephrotic syndrome, asplenia, or immunocompromised conditions. People who received 1-2 doses of PPSV23 before age 35 years should receive another dose of PPSV23 vaccine at age 38 years or later if at least 5 years have passed since the previous dose. Doses of  PPSV23 are not needed for people immunized with PPSV23 at or after age 59 years.  Meningococcal vaccine. Adults with asplenia or persistent complement component deficiencies should receive 2 doses of quadrivalent meningococcal conjugate (MenACWY-D) vaccine. The doses should be obtained at least 2 months apart. Microbiologists working with certain meningococcal bacteria, [employer] recruits, people at risk during an outbreak, and people who travel to or live in countries with a high rate of meningitis should be immunized. A first-year college student up through age 61  years who is living in a residence hall should receive a dose if he did not receive a dose on or after his 16th birthday. Adults who have certain high-risk conditions should receive one or more doses of vaccine.  Hepatitis A vaccine. Adults who wish to be protected from this disease, have certain high-risk conditions, work with hepatitis A-infected animals, work in hepatitis A research labs, or travel to or work in countries with a high rate of hepatitis A should be immunized. Adults who were previously unvaccinated and who anticipate close contact with an international adoptee during the first 60 days after arrival in the [place] from a country with a high rate of hepatitis A should be immunized.  Hepatitis B vaccine. Adults should be immunized if they wish to be protected from this disease, have certain high-risk conditions, may be exposed to blood or other infectious body fluids, are household contacts or sex partners of hepatitis B positive people, are clients or workers in certain care facilities, or travel to or work in countries with a high rate of hepatitis B.  Haemophilus influenzae type b (Hib) vaccine. A previously unvaccinated person with asplenia or sickle cell disease or having a scheduled splenectomy should receive 1 dose of Hib vaccine. Regardless of previous immunization, a recipient of a hematopoietic stem cell transplant  should receive a 3-dose series 6-12 months after his successful transplant. Hib vaccine is not recommended for adults with HIV infection. Preventive Service / Frequency Ages 1 to 56  Blood pressure check.** / Every 1 to 2 years.  Lipid and cholesterol check.** / Every 5 years beginning at age 26.  Hepatitis C blood test.** / For any individual with known risks for hepatitis C.  Skin self-exam. / Monthly.  Influenza vaccine. / Every year.  Tetanus, diphtheria, and acellular pertussis (Tdap, Td) vaccine.** / Consult your health care provider. 1 dose of Td every 10 years.  Varicella vaccine.** / Consult your health care provider.  HPV vaccine. / 3 doses over 6 months, if 26 or younger.  Measles, mumps, rubella (MMR) vaccine.** / You need at least 1 dose of MMR if you were born in 1957 or later. You may also need a second dose.  Pneumococcal 13-valent conjugate (PCV13) vaccine.** / Consult your health care provider.  Pneumococcal polysaccharide (PPSV23) vaccine.** / 1 to 2 doses if you smoke cigarettes or if you have certain conditions.  Meningococcal vaccine.** / 1 dose if you are age 55 to 48 years and a Orthoptist living in a residence hall, or have one of several medical conditions. You may also need additional booster doses.  Hepatitis A vaccine.** / Consult your health care provider.  Hepatitis B vaccine.** / Consult your health care provider.  Haemophilus influenzae type b (Hib) vaccine.** / Consult your health care provider. Ages 64 to 82  Blood pressure check.** / Every 1 to 2 years.  Lipid and cholesterol check.** / Every 5 years beginning at age 67.  Lung cancer screening. / Every year if you are aged 55-80 years and have a 30-pack-year history of smoking and currently smoke or have quit within the past 15 years. Yearly screening is stopped once you have quit smoking for at least 15 years or develop a health problem that would prevent you from having  lung cancer treatment.  Fecal occult blood test (FOBT) of stool. / Every year beginning at age 29 and continuing until age 60. You may not have to do this test if you  get a colonoscopy every 10 years.  Flexible sigmoidoscopy** or colonoscopy.** / Every 5 years for a flexible sigmoidoscopy or every 10 years for a colonoscopy beginning at age 10 and continuing until age 32.  Hepatitis C blood test.** / For all people born from [birth year] through 1963 and any individual with known risks for hepatitis C.  Skin self-exam. / Monthly.  Influenza vaccine. / Every year.  Tetanus, diphtheria, and acellular pertussis (Tdap/Td) vaccine.** / Consult your health care provider. 1 dose of Td every 10 years.  Varicella vaccine.** / Consult your health care provider.  Zoster vaccine.** / 1 dose for adults aged 65 years or older.  Measles, mumps, rubella (MMR) vaccine.** / You need at least 1 dose of MMR if you were born in 1957 or later. You may also need a second dose.  Pneumococcal 13-valent conjugate (PCV13) vaccine.** / Consult your health care provider.  Pneumococcal polysaccharide (PPSV23) vaccine.** / 1 to 2 doses if you smoke cigarettes or if you have certain conditions.  Meningococcal vaccine.** / Consult your health care provider.  Hepatitis A vaccine.** / Consult your health care provider.  Hepatitis B vaccine.** / Consult your health care provider.  Haemophilus influenzae type b (Hib) vaccine.** / Consult your health care provider. Ages 10 and over  Blood pressure check.** / Every 1 to 2 years.  Lipid and cholesterol check.**/ Every 5 years beginning at age 68.  Lung cancer screening. / Every year if you are aged 83-80 years and have a 30-pack-year history of smoking and currently smoke or have quit within the past 15 years. Yearly screening is stopped once you have quit smoking for at least 15 years or develop a health problem that would prevent you from having lung cancer  treatment.  Fecal occult blood test (FOBT) of stool. / Every year beginning at age 70 and continuing until age 36. You may not have to do this test if you get a colonoscopy every 10 years.  Flexible sigmoidoscopy** or colonoscopy.** / Every 5 years for a flexible sigmoidoscopy or every 10 years for a colonoscopy beginning at age 38 and continuing until age 16.  Hepatitis C blood test.** / For all people born from [birth year] through 1963 and any individual with known risks for hepatitis C.  Abdominal aortic aneurysm (AAA) screening.** / A one-time screening for ages 25 to 71 years who are current or former smokers.  Skin self-exam. / Monthly.  Influenza vaccine. / Every year.  Tetanus, diphtheria, and acellular pertussis (Tdap/Td) vaccine.** / 1 dose of Td every 10 years.  Varicella vaccine.** / Consult your health care provider.  Zoster vaccine.** / 1 dose for adults aged 41 years or older.  Pneumococcal 13-valent conjugate (PCV13) vaccine.** / Consult your health care provider.  Pneumococcal polysaccharide (PPSV23) vaccine.** / 1 dose for all adults aged 56 years and older.  Meningococcal vaccine.** / Consult your health care provider.  Hepatitis A vaccine.** / Consult your health care provider.  Hepatitis B vaccine.** / Consult your health care provider.  Haemophilus influenzae type b (Hib) vaccine.** / Consult your health care provider. **Family history and personal history of risk and conditions may change your health care provider's recommendations. Document Released: 07/28/2001 Document Revised: 06/06/2013 Document Reviewed: 10/27/2010 [facility] Patient Information 2015 [place], [place]. This information is not intended to replace advice given to you by your health care provider. Make sure you discuss any questions you have with your health care provider.

## 2014-06-25 NOTE — Progress Notes (Signed)
Pre visit review using our clinic review tool, if applicable. No additional management support is needed unless otherwise documented below in the visit note. 

## 2014-06-26 LAB — CBC
HCT: 44.6 % (ref 39.0–52.0)
Hemoglobin: 14.6 g/dL (ref 13.0–17.0)
MCHC: 32.7 g/dL (ref 30.0–36.0)
MCV: 97.2 fl (ref 78.0–100.0)
PLATELETS: 247 10*3/uL (ref 150.0–400.0)
RBC: 4.59 Mil/uL (ref 4.22–5.81)
RDW: 14.2 % (ref 11.5–15.5)
WBC: 5.8 10*3/uL (ref 4.0–10.5)

## 2014-06-26 LAB — COMPREHENSIVE METABOLIC PANEL
ALT: 45 U/L (ref 0–53)
AST: 32 U/L (ref 0–37)
Albumin: 4.5 g/dL (ref 3.5–5.2)
Alkaline Phosphatase: 65 U/L (ref 39–117)
BUN: 8 mg/dL (ref 6–23)
CO2: 29 mEq/L (ref 19–32)
Calcium: 9.4 mg/dL (ref 8.4–10.5)
Chloride: 107 mEq/L (ref 96–112)
Creatinine, Ser: 0.9 mg/dL (ref 0.4–1.5)
GFR: 120.5 mL/min (ref >60.00)
Glucose, Bld: 88 mg/dL (ref 70–99)
POTASSIUM: 4.4 meq/L (ref 3.5–5.1)
SODIUM: 140 meq/L (ref 135–145)
TOTAL PROTEIN: 7.3 g/dL (ref 6.0–8.3)
Total Bilirubin: 0.9 mg/dL (ref 0.2–1.2)

## 2014-06-26 LAB — TSH: TSH: 1.16 u[IU]/mL (ref 0.35–4.50)

## 2014-06-26 LAB — PSA: PSA: 1.32 ng/mL (ref 0.10–4.00)

## 2014-06-26 LAB — LIPID PANEL
CHOL/HDL RATIO: 4
CHOLESTEROL: 200 mg/dL (ref 0–200)
HDL: 49.7 mg/dL (ref >39.00)
LDL Cholesterol: 141 mg/dL — ABNORMAL HIGH (ref 0–99)
NonHDL: 150.3
TRIGLYCERIDES: 48 mg/dL (ref 0.0–149.0)
VLDL: 9.6 mg/dL (ref 0.0–40.0)

## 2014-07-01 NOTE — Assessment & Plan Note (Addendum)
Patient encouraged to maintain heart healthy diet, regular exercise, adequate sleep. Consider daily probiotics. Take medications as prescribed. Colonoscopy done April 2015. Labs ordered and reviewed

## 2014-07-01 NOTE — Progress Notes (Signed)
Marc EvertsDavid F Allen  [id number] Feb 23, 1961 07/01/2014      Progress Note-Follow Up  Subjective  Chief Complaint  Chief Complaint  Patient presents with  . Annual Exam    HPI  Patient is a 54 y.o. male in today for routine medical care. He is in today for annual exam doing well. No recent illness. Has completed his colonoscopy this year. Is generally well controlled with dietary changes and omeprazole. No recent illness or acute concerns. Denies CP/palp/SOB/HA/congestion/fevers/GI or GU c/o. Taking meds as prescribed  Past Medical History  Diagnosis Date  . GERD (gastroesophageal reflux disease)     not currenlty on any meds  . Seasonal allergies   . Plantar fasciitis   . Flat feet   . TMJ syndrome     Past Surgical History  Procedure Laterality Date  . No past surgeries      denies surgical history    Family History  Problem Relation Age of Onset  . Alcohol abuse Paternal Grandfather   . Stroke Paternal Grandfather   . Alcohol abuse Paternal Uncle   . Diabetes Paternal Uncle   . Hypertension Father   . Prostatitis Father   . Atrial fibrillation Father   . Arthritis Father     DDD  . Lung cancer Maternal Aunt     smoker  . Seizures Son     age 52  . Colon cancer Neg Hx   . Prostate cancer Neg Hx   . Autism Son   . Dementia Maternal Grandmother     History   Social History  . Marital Status: Married    Spouse Name: N/A    Number of Children: N/A  . Years of Education: N/A   Occupational History  . Not on file.   Social History Main Topics  . Smoking status: Never Smoker   . Smokeless tobacco: Never Used  . Alcohol Use: No  . Drug Use: No  . Sexual Activity: Yes     Comment: works at [employer] lives with wife, no dietary restrictions   Other Topics Concern  . Not on file   Social History Narrative   [employer]   Married x 15 years   One son   2 caffeinated beverages daily   Updated as a 08/07/2013          Current Outpatient  Prescriptions on File Prior to Visit  Medication Sig Dispense Refill  . Omeprazole-Sodium Bicarbonate (ZEGERID OTC) 20-1100 MG CAPS capsule Take 1 capsule by mouth daily as needed (to prevent heartburn). (Patient not taking: Reported on 06/25/2014) 28 each 0  . tamsulosin (FLOMAX) 0.4 MG CAPS capsule Take 0.4 mg by mouth daily as needed.      No current facility-administered medications on file prior to visit.    No Known Allergies  Review of Systems  Review of Systems  Constitutional: Negative for fever, chills and malaise/fatigue.  HENT: Negative for congestion, hearing loss and nosebleeds.   Eyes: Negative for discharge.  Respiratory: Negative for cough, sputum production, shortness of breath and wheezing.   Cardiovascular: Negative for chest pain, palpitations and leg swelling.  Gastrointestinal: Negative for heartburn, nausea, vomiting, abdominal pain, diarrhea, constipation and blood in stool.  Genitourinary: Negative for dysuria, urgency, frequency and hematuria.  Musculoskeletal: Negative for myalgias, back pain and falls.  Skin: Negative for rash.  Neurological: Negative for dizziness, tremors, sensory change, focal weakness, loss of consciousness, weakness and headaches.  Endo/Heme/Allergies: Negative for polydipsia. Does not bruise/bleed  easily.  Psychiatric/Behavioral: Negative for depression and suicidal ideas. The patient is not nervous/anxious and does not have insomnia.     Objective  BP 118/78 mmHg  Pulse 50  Temp(Src) 98 F (36.7 C) (Oral)  Ht 6' (1.829 m)  Wt 169 lb (76.658 kg)  BMI 22.92 kg/m2  SpO2 99%  Physical Exam  Physical Exam  Constitutional: He is oriented to person, place, and time and well-developed, well-nourished, and in no distress. No distress.  HENT:  Head: Normocephalic and atraumatic.  Eyes: Conjunctivae are normal.  Neck: Neck supple. No thyromegaly present.  Cardiovascular: Normal rate, regular rhythm and normal heart sounds.   No  murmur Theiss. Pulmonary/Chest: Effort normal and breath sounds normal. No respiratory distress.  Abdominal: Soft. Bowel sounds are normal. He exhibits no distension and no mass. There is no tenderness.  Musculoskeletal: He exhibits no edema.  Neurological: He is alert and oriented to person, place, and time.  Skin: Skin is warm.  Psychiatric: Memory, affect and judgment normal.    Lab Results  Component Value Date   TSH 1.16 06/25/2014   Lab Results  Component Value Date   WBC 5.8 06/25/2014   HGB 14.6 06/25/2014   HCT 44.6 06/25/2014   MCV 97.2 06/25/2014   PLT 247.0 06/25/2014   Lab Results  Component Value Date   CREATININE 0.9 06/25/2014   BUN 8 06/25/2014   NA 140 06/25/2014   K 4.4 06/25/2014   CL 107 06/25/2014   CO2 29 06/25/2014   Lab Results  Component Value Date   ALT 45 06/25/2014   AST 32 06/25/2014   ALKPHOS 65 06/25/2014   BILITOT 0.9 06/25/2014   Lab Results  Component Value Date   CHOL 200 06/25/2014   Lab Results  Component Value Date   HDL 49.70 06/25/2014   Lab Results  Component Value Date   LDLCALC 141* 06/25/2014   Lab Results  Component Value Date   TRIG 48.0 06/25/2014   Lab Results  Component Value Date   CHOLHDL 4 06/25/2014     Assessment & Plan  Hiatal hernia with gastroesophageal reflux Avoid offending foods, start probiotics. Do not eat large meals in late evening and consider raising head of bed. Omeprazole prn   Annual physical exam Patient encouraged to maintain heart healthy diet, regular exercise, adequate sleep. Consider daily probiotics. Take medications as prescribed. Colonoscopy done April 2015. Labs ordered and reviewed

## 2014-07-01 NOTE — Assessment & Plan Note (Signed)
Avoid offending foods, start probiotics. Do not eat large meals in late evening and consider raising head of bed. Omeprazole prn 

## 2015-10-21 ENCOUNTER — Ambulatory Visit (INDEPENDENT_AMBULATORY_CARE_PROVIDER_SITE_OTHER): Payer: BLUE CROSS/BLUE SHIELD | Admitting: Physician Assistant

## 2015-10-21 ENCOUNTER — Encounter: Payer: Self-pay | Admitting: Physician Assistant

## 2015-10-21 VITALS — BP 110/60 | HR 57 | Temp 98.0°F | Ht 72.0 in | Wt 173.2 lb

## 2015-10-21 DIAGNOSIS — M67912 Unspecified disorder of synovium and tendon, left shoulder: Secondary | ICD-10-CM

## 2015-10-21 DIAGNOSIS — M67919 Unspecified disorder of synovium and tendon, unspecified shoulder: Secondary | ICD-10-CM | POA: Insufficient documentation

## 2015-10-21 MED ORDER — TRAMADOL HCL 50 MG PO TABS: 50.0000 mg | ORAL_TABLET | Freq: Two times a day (BID) | ORAL | Status: DC | PRN

## 2015-10-21 MED ORDER — METHYLPREDNISOLONE 4 MG PO TBPK: ORAL_TABLET | ORAL | Status: DC

## 2015-10-21 NOTE — Assessment & Plan Note (Signed)
ROM preserved with good strength but with pain. Will try to treat conservatively. Rx Medrol pack. Tramadol for pain. Topical Aspercreme and supportive measures reviewed. Will refer to Sports medicine if symptoms are not improving significantly.

## 2015-10-21 NOTE — Progress Notes (Signed)
Patient presents to clinic today c/o 2 months of left shoulder pain and discomfort. Endorses pain is described as aching with occasional shooting pain from anterior joint to shoulder blade. Endorses decreased ROM of left shoulder. Endorses some numbness radiating down into the hand. Denies trauma or injury to the shoulder. Denies neck pain. Patient has taken Motrin with little relief in symptoms.   Past Medical History  Diagnosis Date  . GERD (gastroesophageal reflux disease)     not currenlty on any meds  . Seasonal allergies   . Plantar fasciitis   . Flat feet   . TMJ syndrome     No current outpatient prescriptions on file prior to visit.   No current facility-administered medications on file prior to visit.    No Known Allergies  Family History  Problem Relation Age of Onset  . Alcohol abuse Paternal Grandfather   . Stroke Paternal Grandfather   . Alcohol abuse Paternal Uncle   . Diabetes Paternal Uncle   . Hypertension Father   . Prostatitis Father   . Atrial fibrillation Father   . Arthritis Father     DDD  . Lung cancer Maternal Aunt     smoker  . Seizures Son     age 72  . Colon cancer Neg Hx   . Prostate cancer Neg Hx   . Autism Son   . Dementia Maternal Grandmother     Social History   Social History  . Marital Status: Married    Spouse Name: N/A  . Number of Children: N/A  . Years of Education: N/A   Social History Main Topics  . Smoking status: Never Smoker   . Smokeless tobacco: Never Used  . Alcohol Use: No  . Drug Use: No  . Sexual Activity: Yes     Comment: works at [employer] lives with wife, no dietary restrictions   Other Topics Concern  . None   Social History Narrative   [employer]   Married x 15 years   One son   2 caffeinated beverages daily   Updated as a 08/07/2013         Review of Systems - See HPI.  All other ROS are negative.  BP 110/60 mmHg  Pulse 57  Temp(Src) 98 F (36.7 C) (Oral)  Ht 6' (1.829 m)  Wt 173  lb 3.2 oz (78.563 kg)  BMI 23.49 kg/m2  SpO2 99%  Physical Exam  Constitutional: He is oriented to person, place, and time and well-developed, well-nourished, and in no distress.  HENT:  Head: Normocephalic and atraumatic.  Eyes: Conjunctivae are normal.  Neck: Neck supple.  Cardiovascular: Normal rate, regular rhythm, normal heart sounds and intact distal pulses.   Pulmonary/Chest: Effort normal and breath sounds normal. No respiratory distress. He has no wheezes. He has no rales. He exhibits no tenderness.  Musculoskeletal:       Left shoulder: He exhibits tenderness and pain. He exhibits normal range of motion, normal pulse and normal strength.  Neurological: He is alert and oriented to person, place, and time.  Skin: Skin is warm and dry. No rash noted.  Psychiatric: Affect normal.  Vitals reviewed.  No results found for this or any previous visit (from the past 2160 hour(s)).  Assessment/Plan: Tendinopathy of rotator cuff ROM preserved with good strength but with pain. Will try to treat conservatively. Rx Medrol pack. Tramadol for pain. Topical Aspercreme and supportive measures reviewed. Will refer to Sports medicine if symptoms are  not improving significantly.

## 2015-10-21 NOTE — Progress Notes (Signed)
Pre visit review using our clinic review tool, if applicable. No additional management support is needed unless otherwise documented below in the visit note. 

## 2015-10-21 NOTE — Patient Instructions (Signed)
Please take the steroid pack and Tramadol as directed. No heavy lifting. Apply topical Aspercreme or Icy Hot to the area.  See how symptoms respond over the next 7-10 days. If no improvement, we will need to get you in with Sports Medicine.

## 2016-01-31 ENCOUNTER — Ambulatory Visit (HOSPITAL_BASED_OUTPATIENT_CLINIC_OR_DEPARTMENT_OTHER)
Admission: RE | Admit: 2016-01-31 | Discharge: 2016-01-31 | Disposition: A | Discharge status: Home / Self Care | Payer: BLUE CROSS/BLUE SHIELD | Source: Ambulatory Visit | Attending: Medical | Admitting: Medical

## 2016-01-31 ENCOUNTER — Ambulatory Visit (INDEPENDENT_AMBULATORY_CARE_PROVIDER_SITE_OTHER): Payer: BLUE CROSS/BLUE SHIELD | Admitting: Medical

## 2016-01-31 ENCOUNTER — Encounter: Payer: Self-pay | Admitting: Medical

## 2016-01-31 VITALS — BP 107/85 | HR 64 | Temp 97.8°F | Ht 72.0 in | Wt 174.2 lb

## 2016-01-31 DIAGNOSIS — M25512 Pain in left shoulder: Secondary | ICD-10-CM | POA: Insufficient documentation

## 2016-01-31 MED ORDER — DICLOFENAC SODIUM 75 MG PO TBEC
75.0000 mg | DELAYED_RELEASE_TABLET | Freq: Two times a day (BID) | ORAL | 0 refills | Status: DC
Start: 2016-01-31 — End: 2016-02-13

## 2016-01-31 NOTE — Progress Notes (Signed)
Pre visit review using our clinic review tool, if applicable. No additional management support is needed unless otherwise documented below in the visit note. 

## 2016-01-31 NOTE — Progress Notes (Signed)
Subjective:    Patient ID: Marc Allen, male    DOB: 1960/06/26, 55 y.o.   MRN: [id number]  HPI  Pt in with some left side shoulder pain. Pain started 6 months ago. He was seen back then. Told to follow up one week later but did not. He favored area and feels like he did not move it enough and he has concern for frozen shoulder or rotator cuff injury.  Pt states some aching at times to his shoulder. Worse laying on left side.    Review of Systems  Constitutional: Negative for chills, fatigue and fever.  Cardiovascular: Negative for chest pain and palpitations.  Musculoskeletal: Negative for back pain.       Shoulder-pain. See hpi.  Hematological: Negative for adenopathy. Does not bruise/bleed easily.    Past Medical History:  Diagnosis Date  . Flat feet   . GERD (gastroesophageal reflux disease)    not currenlty on any meds  . Plantar fasciitis   . Seasonal allergies   . TMJ syndrome      Social History   Social History  . Marital status: Married    Spouse name: N/A  . Number of children: N/A  . Years of education: N/A   Occupational History  . Not on file.   Social History Main Topics  . Smoking status: Never Smoker  . Smokeless tobacco: Never Used  . Alcohol use No  . Drug use: No  . Sexual activity: Yes     Comment: works at [employer] lives with wife, no dietary restrictions   Other Topics Concern  . Not on file   Social History Narrative   [employer] Driver   Married x 15 years   One son   2 caffeinated beverages daily   Updated as a 08/07/2013          Past Surgical History:  Procedure Laterality Date  . NO PAST SURGERIES     denies surgical history    Family History  Problem Relation Age of Onset  . Alcohol abuse Paternal Grandfather   . Stroke Paternal Grandfather   . Alcohol abuse Paternal Uncle   . Diabetes Paternal Uncle   . Hypertension Father   . Prostatitis Father   . Atrial fibrillation Father   . Arthritis Father     DDD  .  Lung cancer Maternal Aunt     smoker  . Seizures Son     age 53  . Colon cancer Neg Hx   . Prostate cancer Neg Hx   . Autism Son   . Dementia Maternal Grandmother     No Known Allergies  Current Outpatient Prescriptions on File Prior to Visit  Medication Sig Dispense Refill  . Multiple Vitamin (MULTIVITAMIN) tablet Take 1 tablet by mouth daily. One-A-Day-Take 1 tablet by mouth daily.    Marland Kitchen. OVER THE COUNTER MEDICATION Calcium-Take 1 tablet by mouth daily.    . traMADol (ULTRAM) 50 MG tablet Take 1 tablet (50 mg total) by mouth every 12 (twelve) hours as needed. (Patient not taking: Reported on 01/31/2016) 30 tablet 0   No current facility-administered medications on file prior to visit.     BP 107/85 (BP Location: Right Arm)   Pulse 64   Temp 97.8 F (36.6 C) (Oral)   Ht 6' (1.829 m)   Wt 174 lb 3.2 oz (79 kg)   SpO2 98%   BMI 23.63 kg/m       Objective:   Physical Exam  General- No acute distress. Pleasant patient. Neck- Full range of motion, no jvd Lungs- Clear, even and unlabored. Heart- regular rate and rhythm. Neurologic- CNII- XII grossly intact. Lt shoulder- pain on rom. Limtied rom. Can't abduct left arm to shoulder level. He demonstrate can bring his left arm behind his back as well.        Assessment & Plan:  For your shoulder pain will get xray today.  Based on duration of pain and limited rom will go ahead and refer you to ortho to evaluate if rotator cuff injury.  Will rx diclofenac for pain and inflammation.  Follow up in 2 weeks or as needed  Melodee Lupe, Ramon DredgeEdward, VF CorporationPA-C

## 2016-01-31 NOTE — Patient Instructions (Addendum)
For your shoulder pain will get xray today.  Based on duration of pain and limited rom will go ahead and refer you to ortho to evaluate if rotator cuff injury.  Will rx diclofenac for pain and inflammation.   Follow up in 2 weeks or as needed

## 2016-02-13 ENCOUNTER — Encounter: Payer: Self-pay | Admitting: Family Medicine

## 2016-02-13 ENCOUNTER — Other Ambulatory Visit: Payer: Self-pay | Admitting: Medical

## 2016-02-13 ENCOUNTER — Other Ambulatory Visit: Payer: Self-pay | Admitting: Family Medicine

## 2016-02-13 ENCOUNTER — Ambulatory Visit (INDEPENDENT_AMBULATORY_CARE_PROVIDER_SITE_OTHER): Payer: BLUE CROSS/BLUE SHIELD | Admitting: Family Medicine

## 2016-02-13 VITALS — BP 116/84 | HR 68 | Temp 97.9°F | Ht 72.0 in | Wt 167.4 lb

## 2016-02-13 DIAGNOSIS — M05779 Rheumatoid arthritis with rheumatoid factor of unspecified ankle and foot without organ or systems involvement: Secondary | ICD-10-CM

## 2016-02-13 DIAGNOSIS — K219 Gastro-esophageal reflux disease without esophagitis: Secondary | ICD-10-CM | POA: Diagnosis not present

## 2016-02-13 DIAGNOSIS — M2550 Pain in unspecified joint: Secondary | ICD-10-CM | POA: Diagnosis not present

## 2016-02-13 DIAGNOSIS — Z23 Encounter for immunization: Secondary | ICD-10-CM | POA: Diagnosis not present

## 2016-02-13 DIAGNOSIS — Z0000 Encounter for general adult medical examination without abnormal findings: Secondary | ICD-10-CM

## 2016-02-13 DIAGNOSIS — E785 Hyperlipidemia, unspecified: Secondary | ICD-10-CM

## 2016-02-13 DIAGNOSIS — E162 Hypoglycemia, unspecified: Secondary | ICD-10-CM | POA: Diagnosis not present

## 2016-02-13 DIAGNOSIS — K449 Diaphragmatic hernia without obstruction or gangrene: Secondary | ICD-10-CM

## 2016-02-13 HISTORY — DX: Hyperlipidemia, unspecified: E78.5

## 2016-02-13 LAB — COMPREHENSIVE METABOLIC PANEL
ALT: 17 U/L (ref 0–53)
AST: 17 U/L (ref 0–37)
Albumin: 4.8 g/dL (ref 3.5–5.2)
Alkaline Phosphatase: 57 U/L (ref 39–117)
BILIRUBIN TOTAL: 0.9 mg/dL (ref 0.2–1.2)
BUN: 12 mg/dL (ref 6–23)
CHLORIDE: 104 meq/L (ref 96–112)
CO2: 32 meq/L (ref 19–32)
Calcium: 9.7 mg/dL (ref 8.4–10.5)
Creatinine, Ser: 0.95 mg/dL (ref 0.40–1.50)
GFR: 106.74 mL/min (ref >60.00)
GLUCOSE: 90 mg/dL (ref 70–99)
Potassium: 4.2 mEq/L (ref 3.5–5.1)
Sodium: 141 mEq/L (ref 135–145)
Total Protein: 7.9 g/dL (ref 6.0–8.3)

## 2016-02-13 LAB — LIPID PANEL
CHOL/HDL RATIO: 4
Cholesterol: 192 mg/dL (ref 0–200)
HDL: 53.7 mg/dL (ref >39.00)
LDL Cholesterol: 124 mg/dL — ABNORMAL HIGH (ref 0–99)
NONHDL: 138.38
Triglycerides: 70 mg/dL (ref 0.0–149.0)
VLDL: 14 mg/dL (ref 0.0–40.0)

## 2016-02-13 LAB — CBC
HCT: 46.8 % (ref 39.0–52.0)
HEMOGLOBIN: 15.6 g/dL (ref 13.0–17.0)
MCHC: 33.4 g/dL (ref 30.0–36.0)
MCV: 95.7 fl (ref 78.0–100.0)
PLATELETS: 258 10*3/uL (ref 150.0–400.0)
RBC: 4.9 Mil/uL (ref 4.22–5.81)
RDW: 14.2 % (ref 11.5–15.5)
WBC: 6.6 10*3/uL (ref 4.0–10.5)

## 2016-02-13 LAB — RHEUMATOID FACTOR: Rhuematoid fact SerPl-aCnc: 23 IU/mL — ABNORMAL HIGH (ref <=14)

## 2016-02-13 LAB — TSH: TSH: 0.76 u[IU]/mL (ref 0.35–4.50)

## 2016-02-13 LAB — HEPATITIS C ANTIBODY: HCV Ab: NEGATIVE

## 2016-02-13 NOTE — Patient Instructions (Addendum)
Avoid nitrites and antibiotics in meats  Preventive Care for Adults, Male A healthy lifestyle and preventive care can promote health and wellness. Preventive health guidelines for men include the following key practices:  A routine yearly physical is a good way to check with your health care provider about your health and preventative screening. It is a chance to share any concerns and updates on your health and to receive a thorough exam.  Visit your dentist for a routine exam and preventative care every 6 months. Brush your teeth twice a day and floss once a day. Good oral hygiene prevents tooth decay and gum disease.  The frequency of eye exams is based on your age, health, family medical history, use of contact lenses, and other factors. Follow your health care provider's recommendations for frequency of eye exams.  Eat a healthy diet. Foods such as vegetables, fruits, whole grains, low-fat dairy products, and lean protein foods contain the nutrients you need without too many calories. Decrease your intake of foods high in solid fats, added sugars, and salt. Eat the right amount of calories for you.Get information about a proper diet from your health care provider, if necessary.  Regular physical exercise is one of the most important things you can do for your health. Most adults should get at least 150 minutes of moderate-intensity exercise (any activity that increases your heart rate and causes you to sweat) each week. In addition, most adults need muscle-strengthening exercises on 2 or more days a week.  Maintain a healthy weight. The body mass index (BMI) is a screening tool to identify possible weight problems. It provides an estimate of body fat based on height and weight. Your health care provider can find your BMI and can help you achieve or maintain a healthy weight.For adults 20 years and older:  A BMI below 18.5 is considered underweight.  A BMI of 18.5 to 24.9 is normal.  A BMI  of 25 to 29.9 is considered overweight.  A BMI of 30 and above is considered obese.  Maintain normal blood lipids and cholesterol levels by exercising and minimizing your intake of saturated fat. Eat a balanced diet with plenty of fruit and vegetables. Blood tests for lipids and cholesterol should begin at age 49 and be repeated every 5 years. If your lipid or cholesterol levels are high, you are over 50, or you are at high risk for heart disease, you may need your cholesterol levels checked more frequently.Ongoing high lipid and cholesterol levels should be treated with medicines if diet and exercise are not working.  If you smoke, find out from your health care provider how to quit. If you do not use tobacco, do not start.  Lung cancer screening is recommended for adults aged 80-80 years who are at high risk for developing lung cancer because of a history of smoking. A yearly low-dose CT scan of the lungs is recommended for people who have at least a 30-pack-year history of smoking and are a current smoker or have quit within the past 15 years. A pack year of smoking is smoking an average of 1 pack of cigarettes a day for 1 year (for example: 1 pack a day for 30 years or 2 packs a day for 15 years). Yearly screening should continue until the smoker has stopped smoking for at least 15 years. Yearly screening should be stopped for people who develop a health problem that would prevent them from having lung cancer treatment.  If you choose  to drink alcohol, do not have more than 2 drinks per day. One drink is considered to be 12 ounces (355 mL) of beer, 5 ounces (148 mL) of wine, or 1.5 ounces (44 mL) of liquor.  Avoid use of street drugs. Do not share needles with anyone. Ask for help if you need support or instructions about stopping the use of drugs.  High blood pressure causes heart disease and increases the risk of stroke. Your blood pressure should be checked at least every 1-2 years. Ongoing  high blood pressure should be treated with medicines, if weight loss and exercise are not effective.  If you are 43-40 years old, ask your health care provider if you should take aspirin to prevent heart disease.  Diabetes screening is done by taking a blood sample to check your blood glucose level after you have not eaten for a certain period of time (fasting). If you are not overweight and you do not have risk factors for diabetes, you should be screened once every 3 years starting at age 77. If you are overweight or obese and you are 45-24 years of age, you should be screened for diabetes every year as part of your cardiovascular risk assessment.  Colorectal cancer can be detected and often prevented. Most routine colorectal cancer screening begins at the age of 56 and continues through age 26. However, your health care provider may recommend screening at an earlier age if you have risk factors for colon cancer. On a yearly basis, your health care provider may provide home test kits to check for hidden blood in the stool. Use of a small camera at the end of a tube to directly examine the colon (sigmoidoscopy or colonoscopy) can detect the earliest forms of colorectal cancer. Talk to your health care provider about this at age 45, when routine screening begins. Direct exam of the colon should be repeated every 5-10 years through age 29, unless early forms of precancerous polyps or small growths are found.  People who are at an increased risk for hepatitis B should be screened for this virus. You are considered at high risk for hepatitis B if:  You were born in a country where hepatitis B occurs often. Talk with your health care provider about which countries are considered high risk.  Your parents were born in a high-risk country and you have not received a shot to protect against hepatitis B (hepatitis B vaccine).  You have HIV or AIDS.  You use needles to inject street drugs.  You live with, or  have sex with, someone who has hepatitis B.  You are a man who has sex with other men (MSM).  You get hemodialysis treatment.  You take certain medicines for conditions such as cancer, organ transplantation, and autoimmune conditions.  Hepatitis C blood testing is recommended for all people born from [birth year] through 1965 and any individual with known risks for hepatitis C.  Practice safe sex. Use condoms and avoid high-risk sexual practices to reduce the spread of sexually transmitted infections (STIs). STIs include gonorrhea, chlamydia, syphilis, trichomonas, herpes, HPV, and human immunodeficiency virus (HIV). Herpes, HIV, and HPV are viral illnesses that have no cure. They can result in disability, cancer, and death.  If you are a man who has sex with other men, you should be screened at least once per year for:  HIV.  Urethral, rectal, and pharyngeal infection of gonorrhea, chlamydia, or both.  If you are at risk of being infected with HIV,  it is recommended that you take a prescription medicine daily to prevent HIV infection. This is called preexposure prophylaxis (PrEP). You are considered at risk if:  You are a man who has sex with other men (MSM) and have other risk factors.  You are a heterosexual man, are sexually active, and are at increased risk for HIV infection.  You take drugs by injection.  You are sexually active with a partner who has HIV.  Talk with your health care provider about whether you are at high risk of being infected with HIV. If you choose to begin PrEP, you should first be tested for HIV. You should then be tested every 3 months for as long as you are taking PrEP.  A one-time screening for abdominal aortic aneurysm (AAA) and surgical repair of large AAAs by ultrasound are recommended for men ages 1 to 44 years who are current or former smokers.  Healthy men should no longer receive prostate-specific antigen (PSA) blood tests as part of routine cancer  screening. Talk with your health care provider about prostate cancer screening.  Testicular cancer screening is not recommended for adult males who have no symptoms. Screening includes self-exam, a health care provider exam, and other screening tests. Consult with your health care provider about any symptoms you have or any concerns you have about testicular cancer.  Use sunscreen. Apply sunscreen liberally and repeatedly throughout the day. You should seek shade when your shadow is shorter than you. Protect yourself by wearing long sleeves, pants, a wide-brimmed hat, and sunglasses year round, whenever you are outdoors.  Once a month, do a whole-body skin exam, using a mirror to look at the skin on your back. Tell your health care provider about new moles, moles that have irregular borders, moles that are larger than a pencil eraser, or moles that have changed in shape or color.  Stay current with required vaccines (immunizations).  Influenza vaccine. All adults should be immunized every year.  Tetanus, diphtheria, and acellular pertussis (Td, Tdap) vaccine. An adult who has not previously received Tdap or who does not know his vaccine status should receive 1 dose of Tdap. This initial dose should be followed by tetanus and diphtheria toxoids (Td) booster doses every 10 years. Adults with an unknown or incomplete history of completing a 3-dose immunization series with Td-containing vaccines should begin or complete a primary immunization series including a Tdap dose. Adults should receive a Td booster every 10 years.  Varicella vaccine. An adult without evidence of immunity to varicella should receive 2 doses or a second dose if he has previously received 1 dose.  Human papillomavirus (HPV) vaccine. Males aged 11-21 years who have not received the vaccine previously should receive the 3-dose series. Males aged 22-26 years may be immunized. Immunization is recommended through the age of 89 years for  any male who has sex with males and did not get any or all doses earlier. Immunization is recommended for any person with an immunocompromised condition through the age of 10 years if he did not get any or all doses earlier. During the 3-dose series, the second dose should be obtained 4-8 weeks after the first dose. The third dose should be obtained 24 weeks after the first dose and 16 weeks after the second dose.  Zoster vaccine. One dose is recommended for adults aged 74 years or older unless certain conditions are present.  Measles, mumps, and rubella (MMR) vaccine. Adults born before [birth year] generally are considered immune to  measles and mumps. Adults born in [birth year] or later should have 1 or more doses of MMR vaccine unless there is a contraindication to the vaccine or there is laboratory evidence of immunity to each of the three diseases. A routine second dose of MMR vaccine should be obtained at least 28 days after the first dose for students attending postsecondary schools, health care workers, or international travelers. People who received inactivated measles vaccine or an unknown type of measles vaccine during 1963-1967 should receive 2 doses of MMR vaccine. People who received inactivated mumps vaccine or an unknown type of mumps vaccine before 1979 and are at high risk for mumps infection should consider immunization with 2 doses of MMR vaccine. Unvaccinated health care workers born before [birth year] who lack laboratory evidence of measles, mumps, or rubella immunity or laboratory confirmation of disease should consider measles and mumps immunization with 2 doses of MMR vaccine or rubella immunization with 1 dose of MMR vaccine.  Pneumococcal 13-valent conjugate (PCV13) vaccine. When indicated, a person who is uncertain of his immunization history and has no record of immunization should receive the PCV13 vaccine. All adults 30 years of age and older should receive this vaccine. An adult aged 62 years or  older who has certain medical conditions and has not been previously immunized should receive 1 dose of PCV13 vaccine. This PCV13 should be followed with a dose of pneumococcal polysaccharide (PPSV23) vaccine. Adults who are at high risk for pneumococcal disease should obtain the PPSV23 vaccine at least 8 weeks after the dose of PCV13 vaccine. Adults older than 53 years of age who have normal immune system function should obtain the PPSV23 vaccine dose at least 1 year after the dose of PCV13 vaccine.  Pneumococcal polysaccharide (PPSV23) vaccine. When PCV13 is also indicated, PCV13 should be obtained first. All adults aged 46 years and older should be immunized. An adult younger than age 42 years who has certain medical conditions should be immunized. Any person who resides in a nursing home or long-term care facility should be immunized. An adult smoker should be immunized. People with an immunocompromised condition and certain other conditions should receive both PCV13 and PPSV23 vaccines. People with human immunodeficiency virus (HIV) infection should be immunized as soon as possible after diagnosis. Immunization during chemotherapy or radiation therapy should be avoided. Routine use of PPSV23 vaccine is not recommended for American Indians, [place] Natives, or people younger than 65 years unless there are medical conditions that require PPSV23 vaccine. When indicated, people who have unknown immunization and have no record of immunization should receive PPSV23 vaccine. One-time revaccination 5 years after the first dose of PPSV23 is recommended for people aged 19-64 years who have chronic kidney failure, nephrotic syndrome, asplenia, or immunocompromised conditions. People who received 1-2 doses of PPSV23 before age 42 years should receive another dose of PPSV23 vaccine at age 60 years or later if at least 5 years have passed since the previous dose. Doses of PPSV23 are not needed for people immunized with  PPSV23 at or after age 60 years.  Meningococcal vaccine. Adults with asplenia or persistent complement component deficiencies should receive 2 doses of quadrivalent meningococcal conjugate (MenACWY-D) vaccine. The doses should be obtained at least 2 months apart. Microbiologists working with certain meningococcal bacteria, [employer] recruits, people at risk during an outbreak, and people who travel to or live in countries with a high rate of meningitis should be immunized. A first-year college student up through age 47 years who is living  in a residence hall should receive a dose if he did not receive a dose on or after his 16th birthday. Adults who have certain high-risk conditions should receive one or more doses of vaccine.  Hepatitis A vaccine. Adults who wish to be protected from this disease, have chronic liver disease, work with hepatitis A-infected animals, work in hepatitis A research labs, or travel to or work in countries with a high rate of hepatitis A should be immunized. Adults who were previously unvaccinated and who anticipate close contact with an international adoptee during the first 60 days after arrival in the [place] from a country with a high rate of hepatitis A should be immunized.  Hepatitis B vaccine. Adults should be immunized if they wish to be protected from this disease, are under age 56 years and have diabetes, have chronic liver disease, have had more than one sex partner in the past 6 months, may be exposed to blood or other infectious body fluids, are household contacts or sex partners of hepatitis B positive people, are clients or workers in certain care facilities, or travel to or work in countries with a high rate of hepatitis B.  Haemophilus influenzae type b (Hib) vaccine. A previously unvaccinated person with asplenia or sickle cell disease or having a scheduled splenectomy should receive 1 dose of Hib vaccine. Regardless of previous immunization, a recipient of a  hematopoietic stem cell transplant should receive a 3-dose series 6-12 months after his successful transplant. Hib vaccine is not recommended for adults with HIV infection. Preventive Service / Frequency Ages 20 to 64  Blood pressure check.** / Every 3-5 years.  Lipid and cholesterol check.** / Every 5 years beginning at age 42.  Hepatitis C blood test.** / For any individual with known risks for hepatitis C.  Skin self-exam. / Monthly.  Influenza vaccine. / Every year.  Tetanus, diphtheria, and acellular pertussis (Tdap, Td) vaccine.** / Consult your health care provider. 1 dose of Td every 10 years.  Varicella vaccine.** / Consult your health care provider.  HPV vaccine. / 3 doses over 6 months, if 73 or younger.  Measles, mumps, rubella (MMR) vaccine.** / You need at least 1 dose of MMR if you were born in 1957 or later. You may also need a second dose.  Pneumococcal 13-valent conjugate (PCV13) vaccine.** / Consult your health care provider.  Pneumococcal polysaccharide (PPSV23) vaccine.** / 1 to 2 doses if you smoke cigarettes or if you have certain conditions.  Meningococcal vaccine.** / 1 dose if you are age 60 to 60 years and a [employer] researcher living in a residence hall, or have one of several medical conditions. You may also need additional booster doses.  Hepatitis A vaccine.** / Consult your health care provider.  Hepatitis B vaccine.** / Consult your health care provider.  Haemophilus influenzae type b (Hib) vaccine.** / Consult your health care provider. Ages 23 to 19  Blood pressure check.** / Every year.  Lipid and cholesterol check.** / Every 5 years beginning at age 91.  Lung cancer screening. / Every year if you are aged 49-80 years and have a 30-pack-year history of smoking and currently smoke or have quit within the past 15 years. Yearly screening is stopped once you have quit smoking for at least 15 years or develop a health problem that would  prevent you from having lung cancer treatment.  Fecal occult blood test (FOBT) of stool. / Every year beginning at age 68 and continuing until age  75. You may not have to do this test if you get a colonoscopy every 10 years.  Flexible sigmoidoscopy** or colonoscopy.** / Every 5 years for a flexible sigmoidoscopy or every 10 years for a colonoscopy beginning at age 3 and continuing until age 77.  Hepatitis C blood test.** / For all people born from [birth year] through 1965 and any individual with known risks for hepatitis C.  Skin self-exam. / Monthly.  Influenza vaccine. / Every year.  Tetanus, diphtheria, and acellular pertussis (Tdap/Td) vaccine.** / Consult your health care provider. 1 dose of Td every 10 years.  Varicella vaccine.** / Consult your health care provider.  Zoster vaccine.** / 1 dose for adults aged 12 years or older.  Measles, mumps, rubella (MMR) vaccine.** / You need at least 1 dose of MMR if you were born in 1957 or later. You may also need a second dose.  Pneumococcal 13-valent conjugate (PCV13) vaccine.** / Consult your health care provider.  Pneumococcal polysaccharide (PPSV23) vaccine.** / 1 to 2 doses if you smoke cigarettes or if you have certain conditions.  Meningococcal vaccine.** / Consult your health care provider.  Hepatitis A vaccine.** / Consult your health care provider.  Hepatitis B vaccine.** / Consult your health care provider.  Haemophilus influenzae type b (Hib) vaccine.** / Consult your health care provider. Ages 18 and over  Blood pressure check.** / Every year.  Lipid and cholesterol check.**/ Every 5 years beginning at age 36.  Lung cancer screening. / Every year if you are aged 60-80 years and have a 30-pack-year history of smoking and currently smoke or have quit within the past 15 years. Yearly screening is stopped once you have quit smoking for at least 15 years or develop a health problem that would prevent you from having lung cancer  treatment.  Fecal occult blood test (FOBT) of stool. / Every year beginning at age 63 and continuing until age 82. You may not have to do this test if you get a colonoscopy every 10 years.  Flexible sigmoidoscopy** or colonoscopy.** / Every 5 years for a flexible sigmoidoscopy or every 10 years for a colonoscopy beginning at age 54 and continuing until age 7.  Hepatitis C blood test.** / For all people born from [birth year] through 1963 and any individual with known risks for hepatitis C.  Abdominal aortic aneurysm (AAA) screening.** / A one-time screening for ages 37 to 20 years who are current or former smokers.  Skin self-exam. / Monthly.  Influenza vaccine. / Every year.  Tetanus, diphtheria, and acellular pertussis (Tdap/Td) vaccine.** / 1 dose of Td every 10 years.  Varicella vaccine.** / Consult your health care provider.  Zoster vaccine.** / 1 dose for adults aged 64 years or older.  Pneumococcal 13-valent conjugate (PCV13) vaccine.** / 1 dose for all adults aged 3 years and older.  Pneumococcal polysaccharide (PPSV23) vaccine.** / 1 dose for all adults aged 93 years and older.  Meningococcal vaccine.** / Consult your health care provider.  Hepatitis A vaccine.** / Consult your health care provider.  Hepatitis B vaccine.** / Consult your health care provider.  Haemophilus influenzae type b (Hib) vaccine.** / Consult your health care provider. **Family history and personal history of risk and conditions may change your health care provider's recommendations.   This information is not intended to replace advice given to you by your health care provider. Make sure you discuss any questions you have with your health care provider.   Document Released: 07/28/2001 Document Revised: 06/22/2014 Document  Reviewed: 10/27/2010 Elsevier Interactive Patient Education 2016 [place] Eating Plan DASH stands for "Dietary Approaches to Stop Hypertension." The DASH eating plan is a  healthy eating plan that has been shown to reduce high blood pressure (hypertension). Additional health benefits may include reducing the risk of type 2 diabetes mellitus, heart disease, and stroke. The DASH eating plan may also help with weight loss. WHAT DO I NEED TO KNOW ABOUT THE DASH EATING PLAN? For the DASH eating plan, you will follow these general guidelines:  Choose foods with a percent daily value for sodium of less than 5% (as listed on the food label).  Use salt-free seasonings or herbs instead of table salt or sea salt.  Check with your health care provider or pharmacist before using salt substitutes.  Eat lower-sodium products, often labeled as "lower sodium" or "no salt added."  Eat fresh foods.  Eat more vegetables, fruits, and low-fat dairy products.  Choose whole grains. Look for the word "whole" as the first word in the ingredient list.  Choose fish and skinless chicken or [place] more often than red meat. Limit fish, poultry, and meat to 6 oz (170 g) each day.  Limit sweets, desserts, sugars, and sugary drinks.  Choose heart-healthy fats.  Limit cheese to 1 oz (28 g) per day.  Eat more home-cooked food and less restaurant, buffet, and fast food.  Limit fried foods.  Cook foods using methods other than frying.  Limit canned vegetables. If you do use them, rinse them well to decrease the sodium.  When eating at a restaurant, ask that your food be prepared with less salt, or no salt if possible. WHAT FOODS CAN I EAT? Seek help from a dietitian for individual calorie needs. Grains Whole grain or whole wheat bread. Brown rice. Whole grain or whole wheat pasta. Quinoa, bulgur, and whole grain cereals. Low-sodium cereals. Corn or whole wheat flour tortillas. Whole grain cornbread. Whole grain crackers. Low-sodium crackers. Vegetables Fresh or frozen vegetables (raw, steamed, roasted, or grilled). Low-sodium or reduced-sodium tomato and vegetable juices. Low-sodium  or reduced-sodium tomato sauce and paste. Low-sodium or reduced-sodium canned vegetables.  Fruits All fresh, canned (in natural juice), or frozen fruits. Meat and Other Protein Products Ground beef (85% or leaner), grass-fed beef, or beef trimmed of fat. Skinless chicken or [place]. Ground chicken or [place]. Pork trimmed of fat. All fish and seafood. Eggs. Dried beans, peas, or lentils. Unsalted nuts and seeds. Unsalted canned beans. Dairy Low-fat dairy products, such as skim or 1% milk, 2% or reduced-fat cheeses, low-fat ricotta or cottage cheese, or plain low-fat yogurt. Low-sodium or reduced-sodium cheeses. Fats and Oils Tub margarines without trans fats. Light or reduced-fat mayonnaise and salad dressings (reduced sodium). Avocado. Safflower, olive, or canola oils. Natural peanut or almond butter. Other Unsalted popcorn and pretzels. The items listed above may not be a complete list of recommended foods or beverages. Contact your dietitian for more options. WHAT FOODS ARE NOT RECOMMENDED? Grains White bread. White pasta. White rice. Refined cornbread. Bagels and croissants. Crackers that contain trans fat. Vegetables Creamed or fried vegetables. Vegetables in a cheese sauce. Regular canned vegetables. Regular canned tomato sauce and paste. Regular tomato and vegetable juices. Fruits Dried fruits. Canned fruit in light or heavy syrup. Fruit juice. Meat and Other Protein Products Fatty cuts of meat. Ribs, chicken wings, bacon, sausage, bologna, salami, chitterlings, fatback, hot dogs, bratwurst, and packaged luncheon meats. Salted nuts and seeds. Canned beans with salt. Dairy Whole or 2% milk,  cream, half-and-half, and cream cheese. Whole-fat or sweetened yogurt. Full-fat cheeses or blue cheese. Nondairy creamers and whipped toppings. Processed cheese, cheese spreads, or cheese curds. Condiments Onion and garlic salt, seasoned salt, table salt, and sea salt. Canned and packaged gravies.  Worcestershire sauce. Tartar sauce. Barbecue sauce. Teriyaki sauce. Soy sauce, including reduced sodium. Steak sauce. Fish sauce. Oyster sauce. Cocktail sauce. Horseradish. Ketchup and mustard. Meat flavorings and tenderizers. Bouillon cubes. Hot sauce. Tabasco sauce. Marinades. Taco seasonings. Relishes. Fats and Oils Butter, stick margarine, lard, shortening, ghee, and bacon fat. Coconut, palm kernel, or palm oils. Regular salad dressings. Other Pickles and olives. Salted popcorn and pretzels. The items listed above may not be a complete list of foods and beverages to avoid. Contact your dietitian for more information. WHERE CAN I FIND MORE INFORMATION? National Heart, Lung, and Blood Institute: travelstabloid.com   This information is not intended to replace advice given to you by your health care provider. Make sure you discuss any questions you have with your health care provider.   Document Released: 05/21/2011 Document Revised: 06/22/2014 Document Reviewed: 04/05/2013 Elsevier Interactive Patient Education [employer].

## 2016-02-13 NOTE — Progress Notes (Signed)
Pre visit review using our clinic review tool, if applicable. No additional management support is needed unless otherwise documented below in the visit note. 

## 2016-02-18 ENCOUNTER — Telehealth: Payer: Self-pay | Admitting: Family Medicine

## 2016-02-18 NOTE — Telephone Encounter (Signed)
Called left message to call back 

## 2016-02-18 NOTE — Telephone Encounter (Signed)
°  Relation to ZO:XWRUpt:self Call back number:[phone] Pharmacy:  Reason for call: pt is calling in regards to his lab results from last week, states the results never showed up in my chart

## 2016-02-18 NOTE — Telephone Encounter (Signed)
Pt returned call back for lab results.

## 2016-02-18 NOTE — Telephone Encounter (Signed)
There is a way to re release them but I could not find the button quickly please have someone show you how to re release them and make sure he knows the only concern was the elevated RF I have already referred him to rheumatology for further evaluation. Not sure what happened

## 2016-02-18 NOTE — Telephone Encounter (Signed)
Called the patient back left  Message.

## 2016-02-18 NOTE — Telephone Encounter (Signed)
Patient informed of results/referral.

## 2016-02-23 ENCOUNTER — Encounter: Payer: Self-pay | Admitting: Family Medicine

## 2016-02-23 DIAGNOSIS — M2550 Pain in unspecified joint: Secondary | ICD-10-CM | POA: Insufficient documentation

## 2016-02-23 NOTE — Assessment & Plan Note (Signed)
Complains of worsening joint pains, left knee and left shoulder are the worst. Had to undergo physical therapy for a frozen shoulder but it is doing better. Labs indicate that he has an elevated rheumatoid factor, he is referred to rheumatology for further consideration. Encouraged to stay active and use topical treatments prn

## 2016-02-23 NOTE — Assessment & Plan Note (Signed)
Patient encouraged to maintain heart healthy diet, regular exercise, adequate sleep. Consider daily probiotics. Take medications as prescribed. Given and reviewed copy of ACP documents from [place] Secretary of State and encouraged to complete and return 

## 2016-02-23 NOTE — Progress Notes (Signed)
Patient ID: Marc Allen, male   DOB: 08/14/1960, 55 y.o.   MRN: [id number]   Subjective:    Patient ID: Marc Allen, male    DOB: 15-Feb-1961, 55 y.o.   MRN: [id number]  Chief Complaint  Patient presents with  . Annual Exam    HPI Patient is in today for annual preventative physcial exam and evaluation of medical concerns. He is concerned he may have trouble with his sugar due to his family history and lots of his friends struggling with it. No polyuria or polydipsia. Is noting trouble with joints especially left knee with mild swelling and left shoulder. He has undergone therapy for frozen left shoulder and it is moving better but it still hurts. .Denies CP/palp/SOB/HA/congestion/fevers/GI or GU c/o. Taking meds as prescribed  Past Medical History:  Diagnosis Date  . Flat feet   . GERD (gastroesophageal reflux disease)    not currenlty on any meds  . Hyperlipidemia, mild 02/13/2016  . Plantar fasciitis   . Preventative health care 05/03/2011  . Seasonal allergies   . TMJ syndrome     Past Surgical History:  Procedure Laterality Date  . NO PAST SURGERIES     denies surgical history    Family History  Problem Relation Age of Onset  . Alcohol abuse Paternal Grandfather   . Stroke Paternal Grandfather   . Hypertension Father   . Prostatitis Father   . Atrial fibrillation Father   . Arthritis Father     DDD  . Autism Son   . Dementia Maternal Grandmother   . Alcohol abuse Paternal Uncle   . Arthritis/Rheumatoid Paternal Uncle   . Diabetes Paternal Uncle   . Lung cancer Maternal Aunt     smoker  . Seizures Son     age 31  . Cancer Maternal Uncle     bone cancer, smoker  . Colon cancer Neg Hx   . Prostate cancer Neg Hx     Social History   Social History  . Marital status: Married    Spouse name: N/A  . Number of children: N/A  . Years of education: N/A   Occupational History  . Not on file.   Social History Main Topics  . Smoking status: Never Smoker    . Smokeless tobacco: Never Used  . Alcohol use No  . Drug use: No  . Sexual activity: Yes     Comment: works at [employer] lives with wife, no dietary restrictions   Other Topics Concern  . Not on file   Social History Narrative   [employer]   Married x 15 years   One son   2 caffeinated beverages daily   Updated as a 08/07/2013          Outpatient Medications Prior to Visit  Medication Sig Dispense Refill  . Multiple Vitamin (MULTIVITAMIN) tablet Take 1 tablet by mouth daily. One-A-Day-Take 1 tablet by mouth daily.    Marland Kitchen OVER THE COUNTER MEDICATION Calcium-Take 1 tablet by mouth daily.    . diclofenac (VOLTAREN) 75 MG EC tablet Take 1 tablet (75 mg total) by mouth 2 (two) times daily. 30 tablet 0  . traMADol (ULTRAM) 50 MG tablet Take 1 tablet (50 mg total) by mouth every 12 (twelve) hours as needed. (Patient not taking: Reported on 01/31/2016) 30 tablet 0   No facility-administered medications prior to visit.     No Known Allergies  Review of Systems  Constitutional: Negative for chills, fever and malaise/fatigue.  HENT: Negative.  Negative for congestion and hearing loss.   Eyes: Negative for discharge.  Respiratory: Negative for cough, sputum production and shortness of breath.   Cardiovascular: Negative for chest pain, palpitations and leg swelling.  Gastrointestinal: Negative for abdominal pain, blood in stool, constipation, diarrhea, heartburn, nausea and vomiting.  Genitourinary: Negative for dysuria, frequency, hematuria and urgency.  Musculoskeletal: Positive for joint pain. Negative for back pain, falls and myalgias.  Skin: Negative for rash.  Neurological: Negative for dizziness, sensory change, loss of consciousness, weakness and headaches.  Endo/Heme/Allergies: Negative for environmental allergies. Does not bruise/bleed easily.  Psychiatric/Behavioral: Negative for depression and suicidal ideas. The patient is not nervous/anxious and does not have insomnia.         Objective:    Physical Exam  BP 116/84 (BP Location: Left Arm, Patient Position: Sitting, Cuff Size: Normal)   Pulse 68   Temp 97.9 F (36.6 C) (Oral)   Ht 6' (1.829 m)   Wt 167 lb 6.4 oz (75.9 kg)   SpO2 99%   BMI 22.70 kg/m  Wt Readings from Last 3 Encounters:  02/13/16 167 lb 6.4 oz (75.9 kg)  01/31/16 174 lb 3.2 oz (79 kg)  10/21/15 173 lb 3.2 oz (78.6 kg)     Lab Results  Component Value Date   WBC 6.6 02/13/2016   HGB 15.6 02/13/2016   HCT 46.8 02/13/2016   PLT 258.0 02/13/2016   GLUCOSE 90 02/13/2016   CHOL 192 02/13/2016   TRIG 70.0 02/13/2016   HDL 53.70 02/13/2016   LDLCALC 124 (H) 02/13/2016   ALT 17 02/13/2016   AST 17 02/13/2016   NA 141 02/13/2016   K 4.2 02/13/2016   CL 104 02/13/2016   CREATININE 0.95 02/13/2016   BUN 12 02/13/2016   CO2 32 02/13/2016   TSH 0.76 02/13/2016   PSA 1.32 06/25/2014   HGBA1C 5.5 06/20/2012    Lab Results  Component Value Date   TSH 0.76 02/13/2016   Lab Results  Component Value Date   WBC 6.6 02/13/2016   HGB 15.6 02/13/2016   HCT 46.8 02/13/2016   MCV 95.7 02/13/2016   PLT 258.0 02/13/2016   Lab Results  Component Value Date   NA 141 02/13/2016   K 4.2 02/13/2016   CO2 32 02/13/2016   GLUCOSE 90 02/13/2016   BUN 12 02/13/2016   CREATININE 0.95 02/13/2016   BILITOT 0.9 02/13/2016   ALKPHOS 57 02/13/2016   AST 17 02/13/2016   ALT 17 02/13/2016   PROT 7.9 02/13/2016   ALBUMIN 4.8 02/13/2016   CALCIUM 9.7 02/13/2016   GFR 106.74 02/13/2016   Lab Results  Component Value Date   CHOL 192 02/13/2016   Lab Results  Component Value Date   HDL 53.70 02/13/2016   Lab Results  Component Value Date   LDLCALC 124 (H) 02/13/2016   Lab Results  Component Value Date   TRIG 70.0 02/13/2016   Lab Results  Component Value Date   CHOLHDL 4 02/13/2016   Lab Results  Component Value Date   HGBA1C 5.5 06/20/2012       Assessment & Plan:   Problem List Items Addressed This Visit     Hiatal hernia with gastroesophageal reflux    Avoid offending foods, start probiotics. Do not eat large meals in late evening and consider raising head of bed.       Preventative health care - Primary    Patient encouraged to maintain heart healthy diet, regular exercise,  adequate sleep. Consider daily probiotics. Take medications as prescribed. Given and reviewed copy of ACP documents from [place] [place] Secretary of State and encouraged to complete and return      Hyperlipidemia, mild    Encouraged heart healthy diet, increase exercise, avoid trans fats, consider a krill oil cap daily      Arthralgia    Complains of worsening joint pains, left knee and left shoulder are the worst. Had to undergo physical therapy for a frozen shoulder but it is doing better. Labs indicate that he has an elevated rheumatoid factor, he is referred to rheumatology for further consideration. Encouraged to stay active and use topical treatments prn      Relevant Orders   Rheumatoid Factor (Completed)    Other Visit Diagnoses    Hypoglycemia       Relevant Orders   Comprehensive metabolic panel (Completed)   Encounter for immunization       Relevant Orders   Flu Vaccine QUAD 36+ mos IM (Completed)      I have discontinued Mr. Shawn's traMADol. I am also having him maintain his multivitamin and OVER THE COUNTER MEDICATION.  No orders of the defined types were placed in this encounter.    Danise EdgeBLYTH, Jagjit Riner, MD

## 2016-02-23 NOTE — Assessment & Plan Note (Signed)
Avoid offending foods, start probiotics. Do not eat large meals in late evening and consider raising head of bed.  

## 2016-02-23 NOTE — Assessment & Plan Note (Signed)
Encouraged heart healthy diet, increase exercise, avoid trans fats, consider a krill oil cap daily 

## 2016-03-28 ENCOUNTER — Other Ambulatory Visit: Payer: Self-pay | Admitting: Medical

## 2016-08-11 ENCOUNTER — Ambulatory Visit: Payer: BLUE CROSS/BLUE SHIELD | Admitting: Family Medicine

## 2016-09-22 ENCOUNTER — Encounter: Payer: Self-pay | Admitting: Family Medicine

## 2016-09-22 ENCOUNTER — Ambulatory Visit (INDEPENDENT_AMBULATORY_CARE_PROVIDER_SITE_OTHER): Payer: BLUE CROSS/BLUE SHIELD | Admitting: Family Medicine

## 2016-09-22 VITALS — BP 118/82 | HR 62 | Temp 98.6°F | Ht 72.0 in | Wt 172.4 lb

## 2016-09-22 DIAGNOSIS — R001 Bradycardia, unspecified: Secondary | ICD-10-CM | POA: Diagnosis not present

## 2016-09-22 DIAGNOSIS — R000 Tachycardia, unspecified: Secondary | ICD-10-CM | POA: Diagnosis not present

## 2016-09-22 DIAGNOSIS — R9431 Abnormal electrocardiogram [ECG] [EKG]: Secondary | ICD-10-CM

## 2016-09-22 DIAGNOSIS — E785 Hyperlipidemia, unspecified: Secondary | ICD-10-CM | POA: Diagnosis not present

## 2016-09-22 DIAGNOSIS — Z0000 Encounter for general adult medical examination without abnormal findings: Secondary | ICD-10-CM

## 2016-09-22 DIAGNOSIS — K449 Diaphragmatic hernia without obstruction or gangrene: Secondary | ICD-10-CM

## 2016-09-22 DIAGNOSIS — K219 Gastro-esophageal reflux disease without esophagitis: Secondary | ICD-10-CM

## 2016-09-22 DIAGNOSIS — Z1211 Encounter for screening for malignant neoplasm of colon: Secondary | ICD-10-CM

## 2016-09-22 NOTE — Progress Notes (Signed)
Patient ID: Marc Allen, male   DOB: 05-21-1961, 56 y.o.   MRN: [id number]   Subjective:  I acted as a Neurosurgeon for Marc Edge, MD. Marc Allen, [place]   Patient ID: Marc Allen, male    DOB: 1961/05/29, 55 y.o.   MRN: [id number]  Chief Complaint  Patient presents with  . Follow-up    From CPE.    Hyperlipidemia  This is a chronic problem. Associated symptoms include chest pain. Pertinent negatives include no shortness of breath. Risk factors for coronary artery disease include male sex.    Patient is in today for from an annual examination. Patient has a concern of his heart rate while moving. States that on two separate occasions, yet a few days apart, that his pulse was in between 180-200. Sitting pulse rate is on average 60-65. Patient has a Hx of hiatal hernias, TMJ syndrome, hyperlipidemia. Patient has no additional concerns noted at this time. No recent febrile illness or hosptilaitns. .  Patient Care Team: Bradd Canary, MD as PCP - General (Family Medicine)   Past Medical History:  Diagnosis Date  . Flat feet   . GERD (gastroesophageal reflux disease)    not currenlty on any meds  . Hyperlipidemia, mild 02/13/2016  . Plantar fasciitis   . Preventative health care 05/03/2011  . Seasonal allergies   . Tachycardia 09/23/2016  . TMJ syndrome     Past Surgical History:  Procedure Laterality Date  . NO PAST SURGERIES     denies surgical history    Family History  Problem Relation Age of Onset  . Alcohol abuse Paternal Grandfather   . Stroke Paternal Grandfather   . Hypertension Father   . Prostatitis Father   . Atrial fibrillation Father   . Arthritis Father     DDD  . Autism Son   . Dementia Maternal Grandmother   . Alcohol abuse Paternal Uncle   . Arthritis/Rheumatoid Paternal Uncle   . Diabetes Paternal Uncle   . Lung cancer Maternal Aunt     smoker  . Seizures Son     age 87  . Cancer Maternal Uncle     bone cancer, smoker  . Colon cancer Neg Hx   .  Prostate cancer Neg Hx     Social History   Social History  . Marital status: Married    Spouse name: N/A  . Number of children: N/A  . Years of education: N/A   Occupational History  . Not on file.   Social History Main Topics  . Smoking status: Never Smoker  . Smokeless tobacco: Never Used  . Alcohol use No  . Drug use: No  . Sexual activity: Yes     Comment: works at [employer] lives with wife, no dietary restrictions   Other Topics Concern  . Not on file   Social History Narrative   [employer] Driver   Married x 15 years   One son   2 caffeinated beverages daily   Updated as a 08/07/2013          Outpatient Medications Prior to Visit  Medication Sig Dispense Refill  . diclofenac (VOLTAREN) 75 MG EC tablet TAKE 1 TABLET (75 MG TOTAL) BY MOUTH 2 (TWO) TIMES DAILY. 30 tablet 0  . Multiple Vitamin (MULTIVITAMIN) tablet Take 1 tablet by mouth daily. One-A-Day-Take 1 tablet by mouth daily.    Marland Kitchen OVER THE COUNTER MEDICATION Calcium-Take 1 tablet by mouth daily.     No facility-administered medications  prior to visit.     No Known Allergies  Review of Systems  Constitutional: Negative for fever and malaise/fatigue.  HENT: Negative for congestion.   Eyes: Negative for blurred vision.  Respiratory: Negative for shortness of breath.   Cardiovascular: Positive for chest pain. Negative for palpitations and leg swelling.  Gastrointestinal: Positive for abdominal pain and heartburn. Negative for blood in stool and nausea.  Genitourinary: Negative for dysuria and frequency.  Musculoskeletal: Negative for falls.  Skin: Negative for rash.  Neurological: Negative for dizziness, loss of consciousness and headaches.  Endo/Heme/Allergies: Negative for environmental allergies.  Psychiatric/Behavioral: Negative for depression. The patient is not nervous/anxious.        Objective:    Physical Exam  Constitutional: He is oriented to person, place, and time. He appears well-developed  and well-nourished. No distress.  HENT:  Head: Normocephalic and atraumatic.  Eyes: Conjunctivae are normal.  Neck: Neck supple. No thyromegaly present.  Cardiovascular: Normal rate, regular rhythm and normal heart sounds.   No murmur [place]. Pulmonary/Chest: Effort normal and breath sounds normal. No respiratory distress. He has no wheezes.  Abdominal: Soft. Bowel sounds are normal. He exhibits no mass. There is no tenderness.  Musculoskeletal: He exhibits no edema.  Lymphadenopathy:    He has no cervical adenopathy.  Neurological: He is alert and oriented to person, place, and time.  Skin: Skin is warm and dry.  Psychiatric: He has a normal mood and affect. His behavior is normal.    BP 118/82 (BP Location: Left Arm, Patient Position: Sitting, Cuff Size: Normal)   Pulse 62   Temp 98.6 F (37 C) (Oral)   Ht 6' (1.829 m)   Wt 172 lb 6.4 oz (78.2 kg)   SpO2 100% Comment: RA  BMI 23.38 kg/m  Wt Readings from Last 3 Encounters:  09/22/16 172 lb 6.4 oz (78.2 kg)  02/13/16 167 lb 6.4 oz (75.9 kg)  01/31/16 174 lb 3.2 oz (79 kg)   BP Readings from Last 3 Encounters:  09/22/16 118/82  02/13/16 116/84  01/31/16 107/85     Immunization History  Administered Date(s) Administered  . Influenza Split 03/24/2011, 03/08/2012  . Influenza,inj,Quad PF,36+ Mos 06/23/2013, 06/25/2014, 02/13/2016  . Pneumococcal Conjugate-13 06/23/2013  . Td 01/11/2008    Health Maintenance  Topic Date Due  . HIV Screening  04/28/1978  . INFLUENZA VACCINE  01/13/2017  . TETANUS/TDAP  01/10/2018  . COLONOSCOPY  09/30/2023  . Hepatitis C Screening  Completed    Lab Results  Component Value Date   WBC 6.6 02/13/2016   HGB 15.6 02/13/2016   HCT 46.8 02/13/2016   PLT 258.0 02/13/2016   GLUCOSE 90 02/13/2016   CHOL 192 02/13/2016   TRIG 70.0 02/13/2016   HDL 53.70 02/13/2016   LDLCALC 124 (H) 02/13/2016   ALT 17 02/13/2016   AST 17 02/13/2016   NA 141 02/13/2016   K 4.2 02/13/2016   CL 104  02/13/2016   CREATININE 0.95 02/13/2016   BUN 12 02/13/2016   CO2 32 02/13/2016   TSH 0.76 02/13/2016   PSA 1.32 06/25/2014   HGBA1C 5.5 06/20/2012    Lab Results  Component Value Date   TSH 0.76 02/13/2016   Lab Results  Component Value Date   WBC 6.6 02/13/2016   HGB 15.6 02/13/2016   HCT 46.8 02/13/2016   MCV 95.7 02/13/2016   PLT 258.0 02/13/2016   Lab Results  Component Value Date   NA 141 02/13/2016   K 4.2 02/13/2016  CO2 32 02/13/2016   GLUCOSE 90 02/13/2016   BUN 12 02/13/2016   CREATININE 0.95 02/13/2016   BILITOT 0.9 02/13/2016   ALKPHOS 57 02/13/2016   AST 17 02/13/2016   ALT 17 02/13/2016   PROT 7.9 02/13/2016   ALBUMIN 4.8 02/13/2016   CALCIUM 9.7 02/13/2016   GFR 106.74 02/13/2016   Lab Results  Component Value Date   CHOL 192 02/13/2016   Lab Results  Component Value Date   HDL 53.70 02/13/2016   Lab Results  Component Value Date   LDLCALC 124 (H) 02/13/2016   Lab Results  Component Value Date   TRIG 70.0 02/13/2016   Lab Results  Component Value Date   CHOLHDL 4 02/13/2016   Lab Results  Component Value Date   HGBA1C 5.5 06/20/2012         Assessment & Plan:   Problem List Items Addressed This Visit    Hiatal hernia with gastroesophageal reflux    Avoid offending foods, start probiotics. Do not eat large meals in late evening and consider raising head of bed.       Preventative health care   Relevant Orders   PSA   Lipid panel   CBC   Comprehensive metabolic panel   TSH   Hyperlipidemia, mild    encouraged heart healthy diet, avoid trans fats, minimize simple carbs and saturated fats. Increase exercise as tolerated      Relevant Medications   aspirin 81 MG tablet   Tachycardia - Primary    Notes he has had a couple episodes of his pulse approaching 170 to 200 range while on the treadmill at the gym. No associated symptoms. EKG shows possiblity of previous infarct and some bradycardogyia. Will refer to cardiol        Relevant Orders   EKG 12-Lead (Completed)   Exercise Tolerance Test   CBC   Comprehensive metabolic panel   TSH   Ambulatory referral to Cardiology    Other Visit Diagnoses    Bradycardia       Relevant Orders   CBC   Comprehensive metabolic panel   TSH   Ambulatory referral to Cardiology   Hyperlipidemia, unspecified hyperlipidemia type       Relevant Medications   aspirin 81 MG tablet   Other Relevant Orders   Lipid panel   Colon cancer screening       Relevant Orders   PSA   Abnormal EKG       Relevant Orders   Ambulatory referral to Cardiology      I am having Mr. Bache maintain his multivitamin, OVER THE COUNTER MEDICATION, diclofenac, and aspirin.  Meds ordered this encounter  Medications  . aspirin 81 MG tablet    Sig: Take 81 mg by mouth daily.    CMA served as Neurosurgeon during this visit. History, Physical and Plan performed by medical provider. Documentation and orders reviewed and attested to.  Marc Edge, MD

## 2016-09-22 NOTE — Patient Instructions (Signed)
Sinus Tachycardia Sinus tachycardia is a kind of fast heartbeat. In sinus tachycardia, the heart beats more than 100 times a minute. Sinus tachycardia starts in a part of the heart called the sinus node. Sinus tachycardia may be harmless, or it may be a sign of a serious condition. What are the causes? This condition may be caused by:  Exercise or exertion.  A fever.  Pain.  Loss of body fluids (dehydration).  Severe bleeding (hemorrhage).  Anxiety and stress.  Certain substances, including:  Alcohol.  Caffeine.  Tobacco and nicotine products.  Diet pills.  Illegal drugs.  Medical conditions including:  Heart disease.  An infection.  An overactive thyroid (hyperthyroidism).  A lack of red blood cells (anemia). What are the signs or symptoms? Symptoms of this condition include:  A feeling that the heart is beating quickly (palpitations).  Suddenly noticing your heartbeat (cardiac awareness).  Dizziness.  Tiredness (fatigue).  Shortness of breath.  Chest pain.  Nausea.  Fainting. How is this diagnosed? This condition is diagnosed with:  A physical exam.  Other tests, such as:  Blood tests.  An electrocardiogram (ECG). This test measures the electrical activity of the heart.  Holter monitoring. For this test, you wear a device that records your heartbeat for one or more days. You may be referred to a heart specialist (cardiologist). How is this treated? Treatment for this condition depends on the cause or underlying condition. Treatment may involve:  Treating the underlying condition.  Taking new medicines or changing your current medicines as told by your health care provider.  Making changes to your diet or lifestyle.  Practicing relaxation methods. Follow these instructions at home: Lifestyle   Do not use any products that contain nicotine or tobacco, such as cigarettes and e-cigarettes. If you need help quitting, ask your health care  provider.  Learn relaxation methods, like deep breathing, to help you when you get stressed or anxious.  Do not use illegal drugs, such as cocaine.  Do not abuse alcohol. Limit alcohol intake to no more than 1 drink a day for non-pregnant women and 2 drinks a day for men. One drink equals 12 oz of beer, 5 oz of wine, or 1 oz of hard liquor.  Find time to rest and relax often. This reduces stress.  Avoid:  Caffeine.  Stimulants such as over-the-counter diet pills or pills that help you to stay awake.  Situations that cause anxiety or stress. General instructions   Drink enough fluids to keep your urine clear or pale yellow.  Take over-the-counter and prescription medicines only as told by your health care provider.  Keep all follow-up visits as told by your health care provider. This is important. Contact a health care provider if:  You have a fever.  You have vomiting or diarrhea that keeps happening (is persistent). Get help right away if:  You have pain in your chest, upper arms, jaw, or neck.  You become weak or dizzy.  You feel faint.  You have palpitations that do not go away. This information is not intended to replace advice given to you by your health care provider. Make sure you discuss any questions you have with your health care provider. Document Released: 07/09/2004 Document Revised: 12/28/2015 Document Reviewed: 12/14/2014 Elsevier Interactive Patient Education  2017 Elsevier Inc.  

## 2016-09-22 NOTE — Progress Notes (Signed)
Pre visit review using our clinic review tool, if applicable. No additional management support is needed unless otherwise documented below in the visit note. 

## 2016-09-23 ENCOUNTER — Encounter: Payer: Self-pay | Admitting: Family Medicine

## 2016-09-23 DIAGNOSIS — R000 Tachycardia, unspecified: Secondary | ICD-10-CM | POA: Insufficient documentation

## 2016-09-23 HISTORY — DX: Tachycardia, unspecified: R00.0

## 2016-09-23 NOTE — Assessment & Plan Note (Addendum)
Notes he has had a couple episodes of his pulse approaching 170 to 200 range while on the treadmill at the gym. No associated symptoms. EKG shows possiblity of previous infarct and some bradycardogyia. Will refer to cardiol

## 2016-09-23 NOTE — Assessment & Plan Note (Signed)
encouraged heart healthy diet, avoid trans fats, minimize simple carbs and saturated fats. Increase exercise as tolerated 

## 2016-09-23 NOTE — Assessment & Plan Note (Signed)
Avoid offending foods, start probiotics. Do not eat large meals in late evening and consider raising head of bed.  

## 2016-09-30 ENCOUNTER — Telehealth (HOSPITAL_COMMUNITY): Payer: Self-pay

## 2016-09-30 NOTE — Telephone Encounter (Signed)
Encounter complete. 

## 2016-10-02 ENCOUNTER — Ambulatory Visit (HOSPITAL_COMMUNITY)
Admission: RE | Admit: 2016-10-02 | Discharge: 2016-10-02 | Disposition: A | Discharge status: Home / Self Care | Payer: BLUE CROSS/BLUE SHIELD | Source: Ambulatory Visit | Attending: Cardiovascular Disease | Admitting: Cardiovascular Disease

## 2016-10-02 DIAGNOSIS — R000 Tachycardia, unspecified: Secondary | ICD-10-CM | POA: Diagnosis not present

## 2016-10-02 LAB — EXERCISE TOLERANCE TEST
CHL CUP MPHR: 167 {beats}/min
CSEPED: 13 min
CSEPHR: 103 %
CSEPPHR: 173 {beats}/min
Estimated workload: 15.3 METS
Exercise duration (sec): 0 s
RPE: 18
Rest HR: 61 {beats}/min

## 2016-10-30 ENCOUNTER — Ambulatory Visit (INDEPENDENT_AMBULATORY_CARE_PROVIDER_SITE_OTHER): Payer: BLUE CROSS/BLUE SHIELD | Admitting: Internal Medicine

## 2016-10-30 ENCOUNTER — Encounter: Payer: Self-pay | Admitting: Internal Medicine

## 2016-10-30 VITALS — BP 116/82 | HR 60 | Ht 72.0 in | Wt 173.2 lb

## 2016-10-30 DIAGNOSIS — R030 Elevated blood-pressure reading, without diagnosis of hypertension: Secondary | ICD-10-CM | POA: Diagnosis not present

## 2016-10-30 DIAGNOSIS — R000 Tachycardia, unspecified: Secondary | ICD-10-CM | POA: Diagnosis not present

## 2016-10-30 NOTE — Progress Notes (Signed)
New Outpatient Visit Date: 10/30/2016  Referring Provider: Bradd CanaryBlyth, Stacey A, MD [address] [place], [place] [id number]  Chief Complaint: Elevated heart rate and abnormal EKG  HPI:  [name] is a 56 y.o. male who is being seen today for the evaluation of elevated heart rate and abnormal EKG at the request of Dr. Abner GreenspanBlyth. He has a history of hyperlipidemia (not on Medi-Cal therapy) GERD, and seasonal allergies. [name] was recently seen by his PCP, Dr. Abner GreenspanBlyth, at which time he reported elevated heart rate reading that occurred while walking on the treadmill. He was at the gym with his son and noted that the heart rate monitor indicated a heart rate of 180-200 beats a minute. At the time, [name] was feeling well. It did not seem as though his heart were racing, beating stronger than usual, or skipping beats. EKG performed at Dr. Mariel AloeBlyth's office noted abnormal R-wave progression in the anterior leads.  [name] reports that he has felt well. He has not had any chest pain, shortness of breath, palpitations, orthopnea, or PND. He has experienced occasional brief orthostatic lightheadedness when he stands up too quickly. He has not passed out. He notes occasional knee swelling but no significant ankle or calf edema. He also denies orthopnea and PND. He does not have any difficulties working out at [employer] gym. He underwent exercise tolerance test earlier this month without any difficulty. No significant abnormalities were identified.  --------------------------------------------------------------------------------------------------  Cardiovascular History & Procedures: Cardiovascular Problems:  Elevated heart rate  Abnormal EKG  Risk Factors:  Male gender and hyperlipidemia  Cath/PCI:  None  CV Surgery:  None  EP Procedures and Devices:  None  Non-Invasive Evaluation(s):  Exercise tolerance test (10/02/16): Low risk study without ST or T-wave changes to suggest  ischemia. Patient exercised 13 minutes, zero seconds, achieving a peak heart rate of 173 bpm (103% MPHR).  Recent CV Pertinent Labs: Lab Results  Component Value Date   CHOL 192 02/13/2016   HDL 53.70 02/13/2016   LDLCALC 124 (H) 02/13/2016   TRIG 70.0 02/13/2016   CHOLHDL 4 02/13/2016   K 4.2 02/13/2016   BUN 12 02/13/2016   CREATININE 0.95 02/13/2016   CREATININE 0.84 06/23/2013    --------------------------------------------------------------------------------------------------  Past Medical History:  Diagnosis Date  . Flat feet   . GERD (gastroesophageal reflux disease)    not currenlty on any meds  . Hyperlipidemia, mild 02/13/2016  . Plantar fasciitis   . Preventative health care 05/03/2011  . Seasonal allergies   . Tachycardia 09/23/2016  . TMJ syndrome     Past Surgical History:  Procedure Laterality Date  . NO PAST SURGERIES     denies surgical history    Outpatient Encounter Prescriptions as of 10/30/2016  Medication Sig  . aspirin 81 MG tablet Take 81 mg by mouth daily.  . Multiple Vitamin (MULTIVITAMIN) tablet Take 1 tablet by mouth daily. One-A-Day-Take 1 tablet by mouth daily.  Marland Kitchen. OVER THE COUNTER MEDICATION Calcium-Take 1 tablet by mouth daily.  . [DISCONTINUED] diclofenac (VOLTAREN) 75 MG EC tablet TAKE 1 TABLET (75 MG TOTAL) BY MOUTH 2 (TWO) TIMES DAILY.   No facility-administered encounter medications on file as of 10/30/2016.     Allergies: Patient has no known allergies.  Social History   Social History  . Marital status: Married    Spouse name: N/A  . Number of children: N/A  . Years of education: N/A   Occupational History  . Not  on file.   Social History Main Topics  . Smoking status: Never Smoker  . Smokeless tobacco: Never Used  . Alcohol use No  . Drug use: No  . Sexual activity: Yes     Comment: works at [employer] lives with wife, no dietary restrictions   Other Topics Concern  . 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 Alcohol abuse Paternal Grandfather   . Stroke Paternal Grandfather   . Hypertension Father   . Prostatitis Father   . Atrial fibrillation Father   . Arthritis Father        DDD  . Autism Son   . Dementia Maternal Grandmother   . Other Mother        Died in car accident  . Alcohol abuse Paternal Uncle   . Arthritis/Rheumatoid Paternal Uncle   . Diabetes Paternal Uncle   . Lung cancer Maternal Aunt        smoker  . Seizures Son        age 27  . Cancer Maternal Uncle        bone cancer, smoker  . Colon cancer Neg Hx   . Prostate cancer Neg Hx     Review of Systems: A 12-system review of systems was performed and was negative except as noted in the HPI.  --------------------------------------------------------------------------------------------------  Physical Exam: BP 116/82   Pulse 60   Ht 6' (1.829 m)   Wt 173 lb 3.2 oz (78.6 kg)   BMI 23.49 kg/m  Repeat BP 116/84  General:  Well-developed, well-nourished man, seated Within the exam room. HEENT: No conjunctival pallor or scleral icterus.  Moist mucous membranes.  OP clear. Neck: Supple without lymphadenopathy, thyromegaly, JVD, or HJR.  No carotid bruit. Lungs: Normal work of breathing.  Clear to auscultation bilaterally without wheezes or crackles. Heart: Regular rate and rhythm without murmurs, rubs, or gallops.  Non-displaced PMI. Abd: Bowel sounds present.  Soft, NT/ND without hepatosplenomegaly Ext: No lower extremity edema.  Radial, PT, and DP pulses are 2+ bilaterally Skin: warm and dry without rash Neuro: CNIII-XII intact.  Strength and fine-touch sensation intact in upper and lower extremities bilaterally. Psych: Normal mood and affect.  EKG:  Normal sinus rhythm with first-degree AV block (PR interval 214 ms). Otherwise, no significant  abnormalities.  Lab Results  Component Value Date   WBC 6.6 02/13/2016   HGB 15.6 02/13/2016   HCT 46.8 02/13/2016   MCV 95.7 02/13/2016   PLT 258.0 02/13/2016    Lab Results  Component Value Date   NA 141 02/13/2016   K 4.2 02/13/2016   CL 104 02/13/2016   CO2 32 02/13/2016   BUN 12 02/13/2016   CREATININE 0.95 02/13/2016   GLUCOSE 90 02/13/2016   ALT 17 02/13/2016    Lab Results  Component Value Date   CHOL 192 02/13/2016   HDL 53.70 02/13/2016   LDLCALC 124 (H) 02/13/2016   TRIG 70.0 02/13/2016   CHOLHDL 4 02/13/2016   --------------------------------------------------------------------------------------------------  ASSESSMENT AND PLAN: Tachycardia Heart rate is normal today. EKG demonstrates mild first-degree AV block but otherwise no significant abnormalities. Abnormal EKG noted at Dr. Mariel Aloe office may reflect lead placement. Mr. Fellman has not had any palpitations, chest pain,  or shortness of breath. I question the accuracy of the heart rate readings noted on the exercise equipment he was using at the gym. We discussed the utility of further heart rate monitoring and have agreed to defer this. Recent exercise tolerance test was reassuring without any evidence of exercise-induced arrhythmias or ischemia. No further evaluation or intervention at this time.  Elevated blood pressure Diastolic blood pressure is upper normal to mildly elevated today. We discussed lifestyle interventions, including low-sodium diet. I will defer further management to Dr. Abner Greenspan.  Follow-up: Return to clinic as needed.  Yvonne Kendall, MD 10/31/2016 4:35 PM

## 2016-10-30 NOTE — Patient Instructions (Signed)
Medication Instructions:  Your physician recommends that you continue on your current medications as directed. Please refer to the Current Medication list given to you today.   Labwork: None   Testing/Procedures: None   Follow-Up: Your physician recommends that you schedule a follow-up appointment as needed with Dr End.       DASH Eating Plan DASH stands for "Dietary Approaches to Stop Hypertension." The DASH eating plan is a healthy eating plan that has been shown to reduce high blood pressure (hypertension). It may also reduce your risk for type 2 diabetes, heart disease, and stroke. The DASH eating plan may also help with weight loss. What are tips for following this plan? General guidelines   Avoid eating more than 2,300 mg (milligrams) of salt (sodium) a day. If you have hypertension, you may need to reduce your sodium intake to 1,500 mg a day.  Limit alcohol intake to no more than 1 drink a day for nonpregnant women and 2 drinks a day for men. One drink equals 12 oz of beer, 5 oz of wine, or 1 oz of hard liquor.  Work with your health care provider to maintain a healthy body weight or to lose weight. Ask what an ideal weight is for you.  Get at least 30 minutes of exercise that causes your heart to beat faster (aerobic exercise) most days of the week. Activities may include walking, swimming, or biking.  Work with your health care provider or diet and nutrition specialist (dietitian) to adjust your eating plan to your individual calorie needs. Reading food labels   Check food labels for the amount of sodium per serving. Choose foods with less than 5 percent of the Daily Value of sodium. Generally, foods with less than 300 mg of sodium per serving fit into this eating plan.  To find whole grains, look for the word "whole" as the first word in the ingredient list. Shopping   Buy products labeled as "low-sodium" or "no salt added."  Buy fresh foods. Avoid canned foods and  premade or frozen meals. Cooking   Avoid adding salt when cooking. Use salt-free seasonings or herbs instead of table salt or sea salt. Check with your health care provider or pharmacist before using salt substitutes.  Do not fry foods. Cook foods using healthy methods such as baking, boiling, grilling, and broiling instead.  Cook with heart-healthy oils, such as olive, canola, soybean, or sunflower oil. Meal planning    Eat a balanced diet that includes:  5 or more servings of fruits and vegetables each day. At each meal, try to fill half of your plate with fruits and vegetables.  Up to 6-8 servings of whole grains each day.  Less than 6 oz of lean meat, poultry, or fish each day. A 3-oz serving of meat is about the same size as a deck of cards. One egg equals 1 oz.  2 servings of low-fat dairy each day.  A serving of nuts, seeds, or beans 5 times each week.  Heart-healthy fats. Healthy fats called Omega-3 fatty acids are found in foods such as flaxseeds and coldwater fish, like sardines, salmon, and mackerel.  Limit how much you eat of the following:  Canned or prepackaged foods.  Food that is high in trans fat, such as fried foods.  Food that is high in saturated fat, such as fatty meat.  Sweets, desserts, sugary drinks, and other foods with added sugar.  Full-fat dairy products.  Do not salt foods before eating.  Try to eat at least 2 vegetarian meals each week.  Eat more home-cooked food and less restaurant, buffet, and fast food.  When eating at a restaurant, ask that your food be prepared with less salt or no salt, if possible. What foods are recommended? The items listed may not be a complete list. Talk with your dietitian about what dietary choices are best for you. Grains  Whole-grain or whole-wheat bread. Whole-grain or whole-wheat pasta. Brown rice. Orpah Cobb. Bulgur. Whole-grain and low-sodium cereals. Pita bread. Low-fat, low-sodium crackers.  Whole-wheat flour tortillas. Vegetables  Fresh or frozen vegetables (raw, steamed, roasted, or grilled). Low-sodium or reduced-sodium tomato and vegetable juice. Low-sodium or reduced-sodium tomato sauce and tomato paste. Low-sodium or reduced-sodium canned vegetables. Fruits  All fresh, dried, or frozen fruit. Canned fruit in natural juice (without added sugar). Meat and other protein foods  Skinless chicken or [place]. Ground chicken or [place]. Pork with fat trimmed off. Fish and seafood. Egg whites. Dried beans, peas, or lentils. Unsalted nuts, nut butters, and seeds. Unsalted canned beans. Lean cuts of beef with fat trimmed off. Low-sodium, lean deli meat. Dairy  Low-fat (1%) or fat-free (skim) milk. Fat-free, low-fat, or reduced-fat cheeses. Nonfat, low-sodium ricotta or cottage cheese. Low-fat or nonfat yogurt. Low-fat, low-sodium cheese. Fats and oils  Soft margarine without trans fats. Vegetable oil. Low-fat, reduced-fat, or light mayonnaise and salad dressings (reduced-sodium). Canola, safflower, olive, soybean, and sunflower oils. Avocado. Seasoning and other foods  Herbs. Spices. Seasoning mixes without salt. Unsalted popcorn and pretzels. Fat-free sweets. What foods are not recommended? The items listed may not be a complete list. Talk with your dietitian about what dietary choices are best for you. Grains  Baked goods made with fat, such as croissants, muffins, or some breads. Dry pasta or rice meal packs. Vegetables  Creamed or fried vegetables. Vegetables in a cheese sauce. Regular canned vegetables (not low-sodium or reduced-sodium). Regular canned tomato sauce and paste (not low-sodium or reduced-sodium). Regular tomato and vegetable juice (not low-sodium or reduced-sodium). Rosita Fire. Olives. Fruits  Canned fruit in a light or heavy syrup. Fried fruit. Fruit in cream or butter sauce. Meat and other protein foods  Fatty cuts of meat. Ribs. Fried meat. Tomasa Blase. Sausage. Bologna and  other processed lunch meats. Salami. Fatback. Hotdogs. Bratwurst. Salted nuts and seeds. Canned beans with added salt. Canned or smoked fish. Whole eggs or egg yolks. Chicken or [place] with skin. Dairy  Whole or 2% milk, cream, and half-and-half. Whole or full-fat cream cheese. Whole-fat or sweetened yogurt. Full-fat cheese. Nondairy creamers. Whipped toppings. Processed cheese and cheese spreads. Fats and oils  Butter. Stick margarine. Lard. Shortening. Ghee. Bacon fat. Tropical oils, such as coconut, palm kernel, or palm oil. Seasoning and other foods  Salted popcorn and pretzels. Onion salt, garlic salt, seasoned salt, table salt, and sea salt. Worcestershire sauce. Tartar sauce. Barbecue sauce. Teriyaki sauce. Soy sauce, including reduced-sodium. Steak sauce. Canned and packaged gravies. Fish sauce. Oyster sauce. Cocktail sauce. Horseradish that you find on the shelf. Ketchup. Mustard. Meat flavorings and tenderizers. Bouillon cubes. Hot sauce and Tabasco sauce. Premade or packaged marinades. Premade or packaged taco seasonings. Relishes. Regular salad dressings. Where to find more information:  National Heart, Lung, and Blood Institute: PopSteam.is  American Heart Association: www.heart.org Summary  The DASH eating plan is a healthy eating plan that has been shown to reduce high blood pressure (hypertension). It may also reduce your risk for type 2 diabetes, heart disease, and stroke.  With the DASH  eating plan, you should limit salt (sodium) intake to 2,300 mg a day. If you have hypertension, you may need to reduce your sodium intake to 1,500 mg a day.  When on the DASH eating plan, aim to eat more fresh fruits and vegetables, whole grains, lean proteins, low-fat dairy, and heart-healthy fats.  Work with your health care provider or diet and nutrition specialist (dietitian) to adjust your eating plan to your individual calorie needs. This information is not intended to replace advice  given to you by your health care provider. Make sure you discuss any questions you have with your health care provider. Document Released: 05/21/2011 Document Revised: 05/25/2016 Document Reviewed: 05/25/2016 Elsevier Interactive Patient Education  2017 [employer]         If you need a refill on your cardiac medications before your next appointment, please call your pharmacy.

## 2017-02-18 ENCOUNTER — Encounter: Payer: BLUE CROSS/BLUE SHIELD | Admitting: Family Medicine

## 2017-03-23 ENCOUNTER — Other Ambulatory Visit (INDEPENDENT_AMBULATORY_CARE_PROVIDER_SITE_OTHER): Payer: BLUE CROSS/BLUE SHIELD

## 2017-03-23 DIAGNOSIS — E785 Hyperlipidemia, unspecified: Secondary | ICD-10-CM

## 2017-03-23 DIAGNOSIS — R001 Bradycardia, unspecified: Secondary | ICD-10-CM

## 2017-03-23 DIAGNOSIS — Z1211 Encounter for screening for malignant neoplasm of colon: Secondary | ICD-10-CM | POA: Diagnosis not present

## 2017-03-23 DIAGNOSIS — R000 Tachycardia, unspecified: Secondary | ICD-10-CM

## 2017-03-23 DIAGNOSIS — Z0000 Encounter for general adult medical examination without abnormal findings: Secondary | ICD-10-CM | POA: Diagnosis not present

## 2017-03-24 LAB — COMPREHENSIVE METABOLIC PANEL
ALT: 26 U/L (ref 0–53)
AST: 43 U/L — AB (ref 0–37)
Albumin: 4.2 g/dL (ref 3.5–5.2)
Alkaline Phosphatase: 50 U/L (ref 39–117)
BILIRUBIN TOTAL: 0.5 mg/dL (ref 0.2–1.2)
BUN: 8 mg/dL (ref 6–23)
CHLORIDE: 103 meq/L (ref 96–112)
CO2: 30 meq/L (ref 19–32)
Calcium: 9.1 mg/dL (ref 8.4–10.5)
Creatinine, Ser: 0.83 mg/dL (ref 0.40–1.50)
GFR: 124.21 mL/min (ref >60.00)
GLUCOSE: 81 mg/dL (ref 70–99)
POTASSIUM: 4.1 meq/L (ref 3.5–5.1)
SODIUM: 138 meq/L (ref 135–145)
Total Protein: 6.9 g/dL (ref 6.0–8.3)

## 2017-03-24 LAB — LIPID PANEL
CHOL/HDL RATIO: 3
CHOLESTEROL: 164 mg/dL (ref 0–200)
HDL: 51.4 mg/dL (ref >39.00)
LDL CALC: 97 mg/dL (ref 0–99)
NonHDL: 112.83
TRIGLYCERIDES: 81 mg/dL (ref 0.0–149.0)
VLDL: 16.2 mg/dL (ref 0.0–40.0)

## 2017-03-24 LAB — CBC
HEMATOCRIT: 42.7 % (ref 39.0–52.0)
Hemoglobin: 14.4 g/dL (ref 13.0–17.0)
MCHC: 33.6 g/dL (ref 30.0–36.0)
MCV: 97.1 fl (ref 78.0–100.0)
Platelets: 252 10*3/uL (ref 150.0–400.0)
RBC: 4.4 Mil/uL (ref 4.22–5.81)
RDW: 13.8 % (ref 11.5–15.5)
WBC: 5 10*3/uL (ref 4.0–10.5)

## 2017-03-24 LAB — TSH: TSH: 1.14 u[IU]/mL (ref 0.35–4.50)

## 2017-03-24 LAB — PSA: PSA: 1.94 ng/mL (ref 0.10–4.00)

## 2017-03-30 ENCOUNTER — Ambulatory Visit (INDEPENDENT_AMBULATORY_CARE_PROVIDER_SITE_OTHER): Payer: BLUE CROSS/BLUE SHIELD | Admitting: Family Medicine

## 2017-03-30 ENCOUNTER — Encounter: Payer: Self-pay | Admitting: Family Medicine

## 2017-03-30 DIAGNOSIS — Z23 Encounter for immunization: Secondary | ICD-10-CM | POA: Diagnosis not present

## 2017-03-30 DIAGNOSIS — R945 Abnormal results of liver function studies: Secondary | ICD-10-CM | POA: Diagnosis not present

## 2017-03-30 DIAGNOSIS — R7989 Other specified abnormal findings of blood chemistry: Secondary | ICD-10-CM

## 2017-03-30 DIAGNOSIS — M67912 Unspecified disorder of synovium and tendon, left shoulder: Secondary | ICD-10-CM | POA: Diagnosis not present

## 2017-03-30 DIAGNOSIS — R000 Tachycardia, unspecified: Secondary | ICD-10-CM

## 2017-03-30 DIAGNOSIS — K449 Diaphragmatic hernia without obstruction or gangrene: Secondary | ICD-10-CM

## 2017-03-30 DIAGNOSIS — Z0000 Encounter for general adult medical examination without abnormal findings: Secondary | ICD-10-CM | POA: Diagnosis not present

## 2017-03-30 DIAGNOSIS — E785 Hyperlipidemia, unspecified: Secondary | ICD-10-CM | POA: Diagnosis not present

## 2017-03-30 DIAGNOSIS — K219 Gastro-esophageal reflux disease without esophagitis: Secondary | ICD-10-CM | POA: Diagnosis not present

## 2017-03-30 NOTE — Progress Notes (Signed)
Subjective:  I acted as a Neurosurgeon for Dr. Abner Greenspan. Marc Allen, [place]  Patient ID: Marc Allen, male    DOB: 27-Mar-1961, 56 y.o.   MRN: [id number]  No chief complaint on file.   HPI  Patient is in today for an annual exam and follow up on hyperlipidemia and reflux. He reports feeling well today. Has had some intermittent trouble with his hiatal hernia and reflux and resultant chest pain. He has not had a recent episode with dietary changes. He is trying to maintain a heart healthy diet and stay active. He is doing well with activities of daily living. Denies CP/palp/SOB/HA/congestion/fevers/GI or GU c/o. Taking meds as prescribed  Patient Care Team: Marc Canary, MD as PCP - General (Family Medicine)   Past Medical History:  Diagnosis Date  . Flat feet   . GERD (gastroesophageal reflux disease)    not currenlty on any meds  . Hyperlipidemia, mild 02/13/2016  . Plantar fasciitis   . Preventative health care 05/03/2011  . Seasonal allergies   . Tachycardia 09/23/2016  . TMJ syndrome     Past Surgical History:  Procedure Laterality Date  . NO PAST SURGERIES     denies surgical history    Family History  Problem Relation Age of Onset  . Alcohol abuse Paternal Grandfather   . Stroke Paternal Grandfather   . Hypertension Father   . Prostatitis Father   . Atrial fibrillation Father   . Arthritis Father        DDD  . Autism Son   . Dementia Maternal Grandmother   . Other Mother        Died in car accident  . Alcohol abuse Paternal Uncle   . Arthritis/Rheumatoid Paternal Uncle   . Diabetes Paternal Uncle   . Lung cancer Maternal Aunt        smoker  . Seizures Son        age 24  . Cancer Maternal Uncle        bone cancer, smoker  . Colon cancer Neg Hx   . Prostate cancer Neg Hx     Social History   Social History  . Marital status: Married    Spouse name: N/A  . Number of children: N/A  . Years of education: N/A   Occupational History  . Not on file.   Social  History Main Topics  . Smoking status: Never Smoker  . Smokeless tobacco: Never Used  . Alcohol use No  . Drug use: No  . Sexual activity: Yes     Comment: works at [employer] lives with wife, no dietary restrictions   Other Topics Concern  . Not on file   Social History Narrative   [employer]   Married x 15 years   One son   2 caffeinated beverages daily   Updated as a 08/07/2013          Outpatient Medications Prior to Visit  Medication Sig Dispense Refill  . aspirin 81 MG tablet Take 81 mg by mouth daily.    . Multiple Vitamin (MULTIVITAMIN) tablet Take 1 tablet by mouth daily. One-A-Day-Take 1 tablet by mouth daily.    Marland Kitchen OVER THE COUNTER MEDICATION Calcium-Take 1 tablet by mouth daily.     No facility-administered medications prior to visit.     No Known Allergies  Review of Systems  Constitutional: Negative for fever and malaise/fatigue.  HENT: Negative for congestion.   Eyes: Negative for blurred vision.  Respiratory:  Negative for shortness of breath.   Cardiovascular: Negative for chest pain, palpitations and leg swelling.  Gastrointestinal: Negative for abdominal pain, blood in stool and nausea.  Genitourinary: Negative for dysuria and frequency.  Musculoskeletal: Negative for falls.  Skin: Negative for rash.  Neurological: Negative for dizziness, loss of consciousness and headaches.  Endo/Heme/Allergies: Negative for environmental allergies.  Psychiatric/Behavioral: Negative for depression. The patient is not nervous/anxious.        Objective:    Physical Exam  Constitutional: He is oriented to person, place, and time. He appears well-developed and well-nourished. No distress.  HENT:  Head: Normocephalic and atraumatic.  Eyes: Conjunctivae are normal.  Neck: Neck supple. No thyromegaly present.  Cardiovascular: Normal rate, regular rhythm and normal heart sounds.   No murmur Ma. Pulmonary/Chest: Effort normal and breath sounds normal. No  respiratory distress. He has no wheezes.  Abdominal: Soft. Bowel sounds are normal. He exhibits no mass. There is no tenderness.  Genitourinary: Rectal exam shows guaiac negative stool.  Musculoskeletal: He exhibits no edema.  Lymphadenopathy:    He has no cervical adenopathy.  Neurological: He is alert and oriented to person, place, and time.  Skin: Skin is warm and dry.  Psychiatric: He has a normal mood and affect. His behavior is normal.    There were no vitals taken for this visit. Wt Readings from Last 3 Encounters:  10/30/16 173 lb 3.2 oz (78.6 kg)  09/22/16 172 lb 6.4 oz (78.2 kg)  02/13/16 167 lb 6.4 oz (75.9 kg)   BP Readings from Last 3 Encounters:  10/30/16 116/82  09/22/16 118/82  02/13/16 116/84     Immunization History  Administered Date(s) Administered  . Influenza Split 03/24/2011, 03/08/2012  . Influenza,inj,Quad PF,6+ Mos 06/23/2013, 06/25/2014, 02/13/2016  . Pneumococcal Conjugate-13 06/23/2013  . Td 01/11/2008    Health Maintenance  Topic Date Due  . HIV Screening  04/28/1978  . INFLUENZA VACCINE  01/13/2017  . TETANUS/TDAP  01/10/2018  . COLONOSCOPY  09/30/2023  . Hepatitis C Screening  Completed    Lab Results  Component Value Date   WBC 5.0 03/23/2017   HGB 14.4 03/23/2017   HCT 42.7 03/23/2017   PLT 252.0 03/23/2017   GLUCOSE 81 03/23/2017   CHOL 164 03/23/2017   TRIG 81.0 03/23/2017   HDL 51.40 03/23/2017   LDLCALC 97 03/23/2017   ALT 26 03/23/2017   AST 43 (H) 03/23/2017   NA 138 03/23/2017   K 4.1 03/23/2017   CL 103 03/23/2017   CREATININE 0.83 03/23/2017   BUN 8 03/23/2017   CO2 30 03/23/2017   TSH 1.14 03/23/2017   PSA 1.94 03/23/2017   HGBA1C 5.5 06/20/2012    Lab Results  Component Value Date   TSH 1.14 03/23/2017   Lab Results  Component Value Date   WBC 5.0 03/23/2017   HGB 14.4 03/23/2017   HCT 42.7 03/23/2017   MCV 97.1 03/23/2017   PLT 252.0 03/23/2017   Lab Results  Component Value Date   NA 138  03/23/2017   K 4.1 03/23/2017   CO2 30 03/23/2017   GLUCOSE 81 03/23/2017   BUN 8 03/23/2017   CREATININE 0.83 03/23/2017   BILITOT 0.5 03/23/2017   ALKPHOS 50 03/23/2017   AST 43 (H) 03/23/2017   ALT 26 03/23/2017   PROT 6.9 03/23/2017   ALBUMIN 4.2 03/23/2017   CALCIUM 9.1 03/23/2017   GFR 124.21 03/23/2017   Lab Results  Component Value Date   CHOL 164 03/23/2017  Lab Results  Component Value Date   HDL 51.40 03/23/2017   Lab Results  Component Value Date   LDLCALC 97 03/23/2017   Lab Results  Component Value Date   TRIG 81.0 03/23/2017   Lab Results  Component Value Date   CHOLHDL 3 03/23/2017   Lab Results  Component Value Date   HGBA1C 5.5 06/20/2012         Assessment & Plan:   Problem List Items Addressed This Visit    None      I am having Marc Allen maintain his multivitamin, OVER THE COUNTER MEDICATION, and aspirin.  No orders of the defined types were placed in this encounter.   CMA served as Neurosurgeon during this visit. History, Physical and Plan performed by medical provider. Documentation and orders reviewed and attested to.  Crissie Sickles, [place]

## 2017-03-30 NOTE — Patient Instructions (Addendum)
Shingrix is the new shingles shot call insurance and confirm they will cover the shots then call or send a mychart message and we will set up shot if we have them  Preventive Care 40-64 Years, Male Preventive care refers to lifestyle choices and visits with your health care provider that can promote health and wellness. What does preventive care include?  A yearly physical exam. This is also called an annual well check.  Dental exams once or twice a year.  Routine eye exams. Ask your health care provider how often you should have your eyes checked.  Personal lifestyle choices, including: ? Daily care of your teeth and gums. ? Regular physical activity. ? Eating a healthy diet. ? Avoiding tobacco and drug use. ? Limiting alcohol use. ? Practicing safe sex. ? Taking low-dose aspirin every day starting at age 23. What happens during an annual well check? The services and screenings done by your health care provider during your annual well check will depend on your age, overall health, lifestyle risk factors, and family history of disease. Counseling Your health care provider may ask you questions about your:  Alcohol use.  Tobacco use.  Drug use.  Emotional well-being.  Home and relationship well-being.  Sexual activity.  Eating habits.  Work and work Statistician.  Screening You may have the following tests or measurements:  Height, weight, and BMI.  Blood pressure.  Lipid and cholesterol levels. These may be checked every 5 years, or more frequently if you are over 53 years old.  Skin check.  Lung cancer screening. You may have this screening every year starting at age 54 if you have a 30-pack-year history of smoking and currently smoke or have quit within the past 15 years.  Fecal occult blood test (FOBT) of the stool. You may have this test every year starting at age 32.  Flexible sigmoidoscopy or colonoscopy. You may have a sigmoidoscopy every 5 years or a  colonoscopy every 10 years starting at age 52.  Prostate cancer screening. Recommendations will vary depending on your family history and other risks.  Hepatitis C blood test.  Hepatitis B blood test.  Sexually transmitted disease (STD) testing.  Diabetes screening. This is done by checking your blood sugar (glucose) after you have not eaten for a while (fasting). You may have this done every 1-3 years.  Discuss your test results, treatment options, and if necessary, the need for more tests with your health care provider. Vaccines Your health care provider may recommend certain vaccines, such as:  Influenza vaccine. This is recommended every year.  Tetanus, diphtheria, and acellular pertussis (Tdap, Td) vaccine. You may need a Td booster every 10 years.  Varicella vaccine. You may need this if you have not been vaccinated.  Zoster vaccine. You may need this after age 52.  Measles, mumps, and rubella (MMR) vaccine. You may need at least one dose of MMR if you were born in 1957 or later. You may also need a second dose.  Pneumococcal 13-valent conjugate (PCV13) vaccine. You may need this if you have certain conditions and have not been vaccinated.  Pneumococcal polysaccharide (PPSV23) vaccine. You may need one or two doses if you smoke cigarettes or if you have certain conditions.  Meningococcal vaccine. You may need this if you have certain conditions.  Hepatitis A vaccine. You may need this if you have certain conditions or if you travel or work in places where you may be exposed to hepatitis A.  Hepatitis B  vaccine. You may need this if you have certain conditions or if you travel or work in places where you may be exposed to hepatitis B.  Haemophilus influenzae type b (Hib) vaccine. You may need this if you have certain risk factors.  Talk to your health care provider about which screenings and vaccines you need and how often you need them. This information is not intended to  replace advice given to you by your health care provider. Make sure you discuss any questions you have with your health care provider. Document Released: 06/28/2015 Document Revised: 02/19/2016 Document Reviewed: 04/02/2015 Elsevier Interactive Patient Education  2017 [employer] Nonalcoholic Fatty Liver Disease Diet Nonalcoholic fatty liver disease is a condition that causes fat to accumulate in and around the liver. The disease makes it harder for the liver to work the way that it should. Following a healthy diet can help to keep nonalcoholic fatty liver disease under control. It can also help to prevent or improve conditions that are associated with the disease, such as heart disease, diabetes, high blood pressure, and abnormal cholesterol levels. Along with regular exercise, this diet:  Promotes weight loss.  Helps to control blood sugar levels.  Helps to improve the way that the body uses insulin.  What do I need to know about this diet?  Use the glycemic index (GI) to plan your meals. The index tells you how quickly a food will raise your blood sugar. Choose low-GI foods. These foods take a longer time to raise blood sugar.  Keep track of how many calories you take in. Eating the right amount of calories will help you to achieve a healthy weight.  You may want to follow a Mediterranean diet. This diet includes a lot of vegetables, lean meats or fish, whole grains, fruits, and healthy oils and fats. What foods can I eat? Grains Whole grains, such as whole-wheat or whole-grain breads, crackers, tortillas, cereals, and pasta. Stone-ground whole wheat. Pumpernickel bread. Unsweetened oatmeal. Bulgur. Barley. Quinoa. Brown or wild rice. Corn or whole-wheat flour tortillas. Vegetables Lettuce. Spinach. Peas. Beets. Cauliflower. Cabbage. Broccoli. Carrots. Tomatoes. Squash. Eggplant. Herbs. Peppers. Onions. Cucumbers. Brussels sprouts. Yams and sweet potatoes. Beans.  Lentils. Fruits Bananas. Apples. Oranges. Grapes. Papaya. Mango. Pomegranate. Kiwi. Grapefruit. Cherries. Meats and Other Protein Sources Seafood and shellfish. Lean meats. Poultry. Tofu. Dairy Low-fat or fat-free dairy products, such as yogurt, cottage cheese, and cheese. Beverages Water. Sugar-free drinks. Tea. Coffee. Low-fat or skim milk. Milk alternatives, such as soy or almond milk. Real fruit juice. Condiments Mustard. Relish. Low-fat, low-sugar ketchup and barbecue sauce. Low-fat or fat-free mayonnaise. Sweets and Desserts Sugar-free sweets. Fats and Oils Avocado. Canola or olive oil. Nuts and nut butters. Seeds. The items listed above may not be a complete list of recommended foods or beverages. Contact your dietitian for more options. What foods are not recommended? Palm oil and coconut oil. Processed foods. Fried foods. Sweetened drinks, such as sweet tea, milkshakes, snow cones, iced sweet drinks, and sodas. Alcohol. Sweets. Foods that contain a lot of salt or sodium. The items listed above may not be a complete list of foods and beverages to avoid. Contact your dietitian for more information. This information is not intended to replace advice given to you by your health care provider. Make sure you discuss any questions you have with your health care provider. Document Released: 10/16/2014 Document Revised: 11/07/2015 Document Reviewed: 06/26/2014 Elsevier Interactive Patient Education  [employer].

## 2017-03-30 NOTE — Assessment & Plan Note (Signed)
Left shoulder much better but notes some recent increase in stiffness. Try ice and lidocaine and consider referral to sports med.

## 2017-03-31 ENCOUNTER — Encounter: Payer: Self-pay | Admitting: Family Medicine

## 2017-03-31 DIAGNOSIS — R7989 Other specified abnormal findings of blood chemistry: Secondary | ICD-10-CM

## 2017-03-31 DIAGNOSIS — R945 Abnormal results of liver function studies: Secondary | ICD-10-CM

## 2017-03-31 HISTORY — DX: Other specified abnormal findings of blood chemistry: R79.89

## 2017-03-31 LAB — COMPREHENSIVE METABOLIC PANEL
ALBUMIN: 4.4 g/dL (ref 3.5–5.2)
ALK PHOS: 56 U/L (ref 39–117)
ALT: 19 U/L (ref 0–53)
AST: 20 U/L (ref 0–37)
BUN: 9 mg/dL (ref 6–23)
CO2: 27 mEq/L (ref 19–32)
CREATININE: 0.88 mg/dL (ref 0.40–1.50)
Calcium: 9.7 mg/dL (ref 8.4–10.5)
Chloride: 105 mEq/L (ref 96–112)
GFR: 116.1 mL/min (ref >60.00)
Glucose, Bld: 82 mg/dL (ref 70–99)
Potassium: 4.4 mEq/L (ref 3.5–5.1)
SODIUM: 140 meq/L (ref 135–145)
TOTAL PROTEIN: 7.4 g/dL (ref 6.0–8.3)
Total Bilirubin: 0.5 mg/dL (ref 0.2–1.2)

## 2017-03-31 LAB — TSH: TSH: 0.87 u[IU]/mL (ref 0.35–4.50)

## 2017-03-31 LAB — CBC
HEMATOCRIT: 45 % (ref 39.0–52.0)
Hemoglobin: 15 g/dL (ref 13.0–17.0)
MCHC: 33.3 g/dL (ref 30.0–36.0)
MCV: 97.5 fl (ref 78.0–100.0)
Platelets: 273 10*3/uL (ref 150.0–400.0)
RBC: 4.62 Mil/uL (ref 4.22–5.81)
RDW: 14 % (ref 11.5–15.5)
WBC: 6.6 10*3/uL (ref 4.0–10.5)

## 2017-03-31 LAB — LIPID PANEL
CHOLESTEROL: 171 mg/dL (ref 0–200)
HDL: 56.1 mg/dL (ref >39.00)
LDL Cholesterol: 103 mg/dL — ABNORMAL HIGH (ref 0–99)
NonHDL: 115.09
Total CHOL/HDL Ratio: 3
Triglycerides: 61 mg/dL (ref 0.0–149.0)
VLDL: 12.2 mg/dL (ref 0.0–40.0)

## 2017-03-31 NOTE — Assessment & Plan Note (Signed)
Avoid offending foods, start probiotics. Do not eat large meals in late evening and consider raising head of bed. Can try Mylanta prn

## 2017-03-31 NOTE — Assessment & Plan Note (Signed)
Encouraged heart healthy diet, increase exercise, avoid trans fats, consider a krill oil cap daily 

## 2017-03-31 NOTE — Assessment & Plan Note (Signed)
Patient encouraged to maintain heart healthy diet, regular exercise, adequate sleep. Consider daily probiotics. Take medications as prescribed 

## 2017-03-31 NOTE — Assessment & Plan Note (Signed)
Likely fatty liver disease. maintain heart healthy diet, increase exercise, avoid trans fats and consider a krill oil cap daily

## 2018-04-05 ENCOUNTER — Encounter: Payer: Self-pay | Admitting: Family Medicine

## 2018-04-05 ENCOUNTER — Ambulatory Visit (INDEPENDENT_AMBULATORY_CARE_PROVIDER_SITE_OTHER): Payer: BLUE CROSS/BLUE SHIELD | Admitting: Family Medicine

## 2018-04-05 VITALS — BP 98/70 | HR 53 | Temp 98.0°F | Resp 18 | Ht 72.0 in | Wt 183.2 lb

## 2018-04-05 DIAGNOSIS — R768 Other specified abnormal immunological findings in serum: Secondary | ICD-10-CM

## 2018-04-05 DIAGNOSIS — Z0000 Encounter for general adult medical examination without abnormal findings: Secondary | ICD-10-CM | POA: Diagnosis not present

## 2018-04-05 DIAGNOSIS — Z23 Encounter for immunization: Secondary | ICD-10-CM | POA: Diagnosis not present

## 2018-04-05 DIAGNOSIS — M2550 Pain in unspecified joint: Secondary | ICD-10-CM

## 2018-04-05 DIAGNOSIS — K449 Diaphragmatic hernia without obstruction or gangrene: Secondary | ICD-10-CM

## 2018-04-05 DIAGNOSIS — K219 Gastro-esophageal reflux disease without esophagitis: Secondary | ICD-10-CM

## 2018-04-05 DIAGNOSIS — E785 Hyperlipidemia, unspecified: Secondary | ICD-10-CM

## 2018-04-05 DIAGNOSIS — R000 Tachycardia, unspecified: Secondary | ICD-10-CM

## 2018-04-05 MED ORDER — FAMOTIDINE 40 MG PO TABS: 40.0000 mg | ORAL_TABLET | Freq: Every day | ORAL | 2 refills | Status: DC

## 2018-04-05 NOTE — Assessment & Plan Note (Signed)
Encouraged heart healthy diet, increase exercise, avoid trans fats, consider a krill oil cap daily 

## 2018-04-05 NOTE — Patient Instructions (Signed)
Shingrix is the new shingles shot 2 shots over 2-6 months call insurance to find out about coverage and then can return for nurse visit any time including January for immunization Preventive Care 40-64 Years, Male Preventive care refers to lifestyle choices and visits with your health care provider that can promote health and wellness. What does preventive care include?  A yearly physical exam. This is also called an annual well check.  Dental exams once or twice a year.  Routine eye exams. Ask your health care provider how often you should have your eyes checked.  Personal lifestyle choices, including: ? Daily care of your teeth and gums. ? Regular physical activity. ? Eating a healthy diet. ? Avoiding tobacco and drug use. ? Limiting alcohol use. ? Practicing safe sex. ? Taking low-dose aspirin every day starting at age 50. What happens during an annual well check? The services and screenings done by your health care provider during your annual well check will depend on your age, overall health, lifestyle risk factors, and family history of disease. Counseling Your health care provider may ask you questions about your:  Alcohol use.  Tobacco use.  Drug use.  Emotional well-being.  Home and relationship well-being.  Sexual activity.  Eating habits.  Work and work Statistician.  Screening You may have the following tests or measurements:  Height, weight, and BMI.  Blood pressure.  Lipid and cholesterol levels. These may be checked every 5 years, or more frequently if you are over 47 years old.  Skin check.  Lung cancer screening. You may have this screening every year starting at age 14 if you have a 30-pack-year history of smoking and currently smoke or have quit within the past 15 years.  Fecal occult blood test (FOBT) of the stool. You may have this test every year starting at age 61.  Flexible sigmoidoscopy or colonoscopy. You may have a sigmoidoscopy every 5  years or a colonoscopy every 10 years starting at age 13.  Prostate cancer screening. Recommendations will vary depending on your family history and other risks.  Hepatitis C blood test.  Hepatitis B blood test.  Sexually transmitted disease (STD) testing.  Diabetes screening. This is done by checking your blood sugar (glucose) after you have not eaten for a while (fasting). You may have this done every 1-3 years.  Discuss your test results, treatment options, and if necessary, the need for more tests with your health care provider. Vaccines Your health care provider may recommend certain vaccines, such as:  Influenza vaccine. This is recommended every year.  Tetanus, diphtheria, and acellular pertussis (Tdap, Td) vaccine. You may need a Td booster every 10 years.  Varicella vaccine. You may need this if you have not been vaccinated.  Zoster vaccine. You may need this after age 84.  Measles, mumps, and rubella (MMR) vaccine. You may need at least one dose of MMR if you were born in 1957 or later. You may also need a second dose.  Pneumococcal 13-valent conjugate (PCV13) vaccine. You may need this if you have certain conditions and have not been vaccinated.  Pneumococcal polysaccharide (PPSV23) vaccine. You may need one or two doses if you smoke cigarettes or if you have certain conditions.  Meningococcal vaccine. You may need this if you have certain conditions.  Hepatitis A vaccine. You may need this if you have certain conditions or if you travel or work in places where you may be exposed to hepatitis A.  Hepatitis B vaccine. You  may need this if you have certain conditions or if you travel or work in places where you may be exposed to hepatitis B.  Haemophilus influenzae type b (Hib) vaccine. You may need this if you have certain risk factors.  Talk to your health care provider about which screenings and vaccines you need and how often you need them. This information is not  intended to replace advice given to you by your health care provider. Make sure you discuss any questions you have with your health care provider. Document Released: 06/28/2015 Document Revised: 02/19/2016 Document Reviewed: 04/02/2015 Elsevier Interactive Patient Education  [employer].

## 2018-04-06 ENCOUNTER — Encounter: Payer: Self-pay | Admitting: Internal Medicine

## 2018-04-06 LAB — CBC
HCT: 44.4 % (ref 39.0–52.0)
Hemoglobin: 15.2 g/dL (ref 13.0–17.0)
MCHC: 34.1 g/dL (ref 30.0–36.0)
MCV: 95.8 fl (ref 78.0–100.0)
Platelets: 287 10*3/uL (ref 150.0–400.0)
RBC: 4.64 Mil/uL (ref 4.22–5.81)
RDW: 14.2 % (ref 11.5–15.5)
WBC: 5.4 10*3/uL (ref 4.0–10.5)

## 2018-04-06 LAB — COMPREHENSIVE METABOLIC PANEL
ALT: 17 U/L (ref 0–53)
AST: 18 U/L (ref 0–37)
Albumin: 4.5 g/dL (ref 3.5–5.2)
Alkaline Phosphatase: 59 U/L (ref 39–117)
BILIRUBIN TOTAL: 0.6 mg/dL (ref 0.2–1.2)
BUN: 8 mg/dL (ref 6–23)
CO2: 29 meq/L (ref 19–32)
CREATININE: 0.92 mg/dL (ref 0.40–1.50)
Calcium: 9.7 mg/dL (ref 8.4–10.5)
Chloride: 103 mEq/L (ref 96–112)
GFR: 109.87 mL/min (ref >60.00)
GLUCOSE: 80 mg/dL (ref 70–99)
Potassium: 4 mEq/L (ref 3.5–5.1)
SODIUM: 139 meq/L (ref 135–145)
Total Protein: 7.1 g/dL (ref 6.0–8.3)

## 2018-04-06 LAB — LIPID PANEL
CHOL/HDL RATIO: 4
Cholesterol: 179 mg/dL (ref 0–200)
HDL: 45.9 mg/dL (ref >39.00)
LDL Cholesterol: 122 mg/dL — ABNORMAL HIGH (ref 0–99)
NonHDL: 133.25
TRIGLYCERIDES: 54 mg/dL (ref 0.0–149.0)
VLDL: 10.8 mg/dL (ref 0.0–40.0)

## 2018-04-06 LAB — TSH: TSH: 1.1 u[IU]/mL (ref 0.35–4.50)

## 2018-04-06 LAB — PSA: PSA: 0.98 ng/mL (ref 0.10–4.00)

## 2018-04-06 LAB — SEDIMENTATION RATE: Sed Rate: 1 mm/hr (ref 0–20)

## 2018-04-07 LAB — RHEUMATOID FACTOR: Rhuematoid fact SerPl-aCnc: 16 IU/mL — ABNORMAL HIGH (ref <14)

## 2018-04-10 NOTE — Assessment & Plan Note (Signed)
Patient encouraged to maintain heart healthy diet, regular exercise, adequate sleep. Consider daily probiotics. Take medications as prescribed. Labs reviewed 

## 2018-04-10 NOTE — Assessment & Plan Note (Signed)
Started on famotidine and referred to GI for further consideration

## 2018-04-10 NOTE — Assessment & Plan Note (Signed)
RF factor positive, manages his symptoms but does note some increased pain and crepitus in right knee pain recently. Encouraged moist heat and gentle stretching as tolerated. May try NSAIDs and prescription meds as directed and report if symptoms worsen or seek immediate care

## 2018-04-10 NOTE — Progress Notes (Signed)
Subjective:    Patient ID: Marc Allen, male    DOB: 08-24-1960, 57 y.o.   MRN: [id number]  No chief complaint on file.   HPI Patient is in today for annual preventative exam.  He is doing well most days although he does note is having significant heartburn.  He has been told by his dentist that his acid reflux is significantly damaging his dentition and he is in need of treatment.  He tries to maintain a heart healthy diet and minimize offending foods but symptoms occur daily nonetheless.  Stays active.  Manages his activities of daily living well despite some persistent trouble with right knee pain.  He has trouble with the left knee as well but it is not as significant as the right.  He notes some increased crepitus, stiffness and even some weakness secondary to pain.  No falls or trauma. Denies CP/palp/SOB/HA/congestion/fevers/GI or GU c/o. Taking meds as prescribed  Past Medical History:  Diagnosis Date  . Elevated LFTs 03/31/2017  . Flat feet   . GERD (gastroesophageal reflux disease)    not currenlty on any meds  . Hyperlipidemia, mild 02/13/2016  . Plantar fasciitis   . Preventative health care 05/03/2011  . Seasonal allergies   . Tachycardia 09/23/2016  . TMJ syndrome     Past Surgical History:  Procedure Laterality Date  . NO PAST SURGERIES     denies surgical history    Family History  Problem Relation Age of Onset  . Alcohol abuse Paternal Grandfather   . Stroke Paternal Grandfather   . Hypertension Father   . Prostatitis Father   . Atrial fibrillation Father   . Arthritis Father        DDD  . Autism Son   . Dementia Maternal Grandmother   . Other Mother        Died in car accident  . Alcohol abuse Paternal Uncle   . Arthritis/Rheumatoid Paternal Uncle   . Diabetes Paternal Uncle   . Lung cancer Maternal Aunt        smoker  . Seizures Son        age 86  . Cancer Maternal Uncle        bone cancer, smoker  . Colon cancer Neg Hx   . Prostate cancer Neg  Hx     Social History   Socioeconomic History  . Marital status: Married    Spouse name: Not on file  . Number of children: Not on file  . Years of education: Not on file  . Highest education level: Not on file  Occupational History  . Not on file  Social Needs  . Financial resource strain: Not on file  . Food insecurity:    Worry: Not on file    Inability: Not on file  . Transportation needs:    Medical: Not on file    Non-medical: Not on file  Tobacco Use  . Smoking status: Never Smoker  . Smokeless tobacco: Never Used  Substance and Sexual Activity  . Alcohol use: No  . Drug use: No  . Sexual activity: Yes    Comment: works at [employer] lives with wife, no dietary restrictions  Lifestyle  . Physical activity:    Days per week: Not on file    Minutes per session: Not on file  . Stress: Not on file  Relationships  . Social connections:    Talks on phone: Not on file    Gets together: Not on  file    Attends religious service: Not on file    Active member of club or organization: Not on file    Attends meetings of clubs or organizations: Not on file    Relationship status: Not on file  . Intimate partner violence:    Fear of current or ex partner: Not on file    Emotionally abused: Not on file    Physically abused: Not on file    Forced sexual activity: Not on file  Other Topics Concern  . Not on file  Social History Narrative   [employer]   Married x 15 years   One son   2 caffeinated beverages daily   Updated as a 08/07/2013          Outpatient Medications Prior to Visit  Medication Sig Dispense Refill  . aspirin 81 MG tablet Take 81 mg by mouth daily.    . Multiple Vitamin (MULTIVITAMIN) tablet Take 1 tablet by mouth daily. One-A-Day-Take 1 tablet by mouth daily.    Marland Kitchen OVER THE COUNTER MEDICATION Calcium-Take 1 tablet by mouth daily.     No facility-administered medications prior to visit.     No Known Allergies  Review of Systems    Constitutional: Negative for chills, fever and malaise/fatigue.  HENT: Negative for congestion and hearing loss.   Eyes: Negative for discharge.  Respiratory: Negative for cough, sputum production and shortness of breath.   Cardiovascular: Negative for chest pain, palpitations and leg swelling.  Gastrointestinal: Positive for heartburn. Negative for abdominal pain, blood in stool, constipation, diarrhea, nausea and vomiting.  Genitourinary: Negative for dysuria, frequency, hematuria and urgency.  Musculoskeletal: Positive for joint pain. Negative for back pain, falls and myalgias.  Skin: Negative for rash.  Neurological: Negative for dizziness, sensory change, loss of consciousness, weakness and headaches.  Endo/Heme/Allergies: Negative for environmental allergies. Does not bruise/bleed easily.  Psychiatric/Behavioral: Negative for depression and suicidal ideas. The patient is not nervous/anxious and does not have insomnia.        Objective:    Physical Exam  Constitutional: He is oriented to person, place, and time. He appears well-developed and well-nourished. No distress.  HENT:  Head: Normocephalic and atraumatic.  Nose: Nose normal.  Eyes: Right eye exhibits no discharge. Left eye exhibits no discharge.  Neck: Normal range of motion. Neck supple.  Cardiovascular: Normal rate and regular rhythm.  No murmur [place]. Pulmonary/Chest: Effort normal and breath sounds normal.  Abdominal: Soft. Bowel sounds are normal. There is no tenderness.  Musculoskeletal: He exhibits no edema.  Neurological: He is alert and oriented to person, place, and time.  Skin: Skin is warm and dry.  Psychiatric: He has a normal mood and affect.  Nursing note and vitals reviewed.   BP 98/70 (BP Location: Left Arm, Patient Position: Sitting, Cuff Size: Normal)   Pulse (!) 53   Temp 98 F (36.7 C) (Oral)   Resp 18   Ht 6' (1.829 m)   Wt 183 lb 3.2 oz (83.1 kg)   SpO2 97%   BMI 24.85 kg/m  Wt  Readings from Last 3 Encounters:  04/05/18 183 lb 3.2 oz (83.1 kg)  10/30/16 173 lb 3.2 oz (78.6 kg)  09/22/16 172 lb 6.4 oz (78.2 kg)     Lab Results  Component Value Date   WBC 5.4 04/05/2018   HGB 15.2 04/05/2018   HCT 44.4 04/05/2018   PLT 287.0 04/05/2018   GLUCOSE 80 04/05/2018   CHOL 179 04/05/2018  TRIG 54.0 04/05/2018   HDL 45.90 04/05/2018   LDLCALC 122 (H) 04/05/2018   ALT 17 04/05/2018   AST 18 04/05/2018   NA 139 04/05/2018   K 4.0 04/05/2018   CL 103 04/05/2018   CREATININE 0.92 04/05/2018   BUN 8 04/05/2018   CO2 29 04/05/2018   TSH 1.10 04/05/2018   PSA 0.98 04/05/2018   HGBA1C 5.5 06/20/2012    Lab Results  Component Value Date   TSH 1.10 04/05/2018   Lab Results  Component Value Date   WBC 5.4 04/05/2018   HGB 15.2 04/05/2018   HCT 44.4 04/05/2018   MCV 95.8 04/05/2018   PLT 287.0 04/05/2018   Lab Results  Component Value Date   NA 139 04/05/2018   K 4.0 04/05/2018   CO2 29 04/05/2018   GLUCOSE 80 04/05/2018   BUN 8 04/05/2018   CREATININE 0.92 04/05/2018   BILITOT 0.6 04/05/2018   ALKPHOS 59 04/05/2018   AST 18 04/05/2018   ALT 17 04/05/2018   PROT 7.1 04/05/2018   ALBUMIN 4.5 04/05/2018   CALCIUM 9.7 04/05/2018   GFR 109.87 04/05/2018   Lab Results  Component Value Date   CHOL 179 04/05/2018   Lab Results  Component Value Date   HDL 45.90 04/05/2018   Lab Results  Component Value Date   LDLCALC 122 (H) 04/05/2018   Lab Results  Component Value Date   TRIG 54.0 04/05/2018   Lab Results  Component Value Date   CHOLHDL 4 04/05/2018   Lab Results  Component Value Date   HGBA1C 5.5 06/20/2012       Assessment & Plan:   Problem List Items Addressed This Visit    Hiatal hernia with gastroesophageal reflux    Started on famotidine and referred to GI for further consideration      Relevant Medications   famotidine (PEPCID) 40 MG tablet   Preventative health care    Patient encouraged to maintain heart healthy  diet, regular exercise, adequate sleep. Consider daily probiotics. Take medications as prescribed. Labs reviewed.       Relevant Orders   CBC (Completed)   Comprehensive metabolic panel (Completed)   Lipid panel (Completed)   TSH (Completed)   PSA (Completed)   Hyperlipidemia, mild    Encouraged heart healthy diet, increase exercise, avoid trans fats, consider a krill oil cap daily      Arthralgia    RF factor positive, manages his symptoms but does note some increased pain and crepitus in right knee pain recently. Encouraged moist heat and gentle stretching as tolerated. May try NSAIDs and prescription meds as directed and report if symptoms worsen or seek immediate care      Tachycardia    RRR today       Other Visit Diagnoses    Gastroesophageal reflux disease, esophagitis presence not specified    -  Primary   Relevant Medications   famotidine (PEPCID) 40 MG tablet   Other Relevant Orders   Ambulatory referral to Gastroenterology   Rheumatoid Factor (Completed)   Sedimentation rate (Completed)   Rheumatoid factor positive          I am having Candace Cruise. Wilkie start on famotidine. I am also having him maintain his multivitamin, OVER THE COUNTER MEDICATION, and aspirin.  Meds ordered this encounter  Medications  . famotidine (PEPCID) 40 MG tablet    Sig: Take 1 tablet (40 mg total) by mouth at bedtime.    Dispense:  30 tablet  Refill:  2     Danise Edge, MD

## 2018-04-10 NOTE — Assessment & Plan Note (Signed)
RRR today 

## 2018-05-05 ENCOUNTER — Other Ambulatory Visit: Payer: Self-pay | Admitting: Family Medicine

## 2018-05-18 ENCOUNTER — Ambulatory Visit: Payer: BLUE CROSS/BLUE SHIELD | Admitting: Internal Medicine

## 2018-06-29 ENCOUNTER — Ambulatory Visit: Payer: BLUE CROSS/BLUE SHIELD | Admitting: Internal Medicine

## 2018-06-29 ENCOUNTER — Encounter: Payer: Self-pay | Admitting: Internal Medicine

## 2018-06-29 VITALS — BP 108/70 | HR 66 | Ht 72.0 in | Wt 182.4 lb

## 2018-06-29 DIAGNOSIS — K032 Erosion of teeth: Secondary | ICD-10-CM | POA: Diagnosis not present

## 2018-06-29 DIAGNOSIS — K219 Gastro-esophageal reflux disease without esophagitis: Secondary | ICD-10-CM

## 2018-06-29 NOTE — Progress Notes (Signed)
Marc EvertsDavid F Allen 58 y.o. 10-17-1960 [id number]  Assessment & Plan:   Encounter Diagnoses  Name Primary?  . Gastroesophageal reflux disease, esophagitis presence not specified Yes  . Dental erosion, generalized     He may be a good candidate for a transoral incision less fundoplication or TIF procedure.  The patient would like to control his reflux problems without medication.  If he is having reflux leading to dental erosions then it would seem likely increasing his lower esophageal sphincter barrier would help.  I will have him see Dr. Barron Alvineirigliano for an evaluation.  Any further testing required may be coordinated by Dr. Barron Alvineirigliano including endoscopy if he sees fit.  I did show some video and cartoon images of the TIF procedure to try to explain what it entails but that was very brief and I defer to Dr. Barron Alvineirigliano for further details including risks benefits and  indications. I appreciate the opportunity to care for this patient. CC: Bradd CanaryBlyth, Stacey A, MD   Subjective:   Chief Complaint: Reflux  HPI The patient is here with concerns about reflux.  He takes famotidine daily and for the most part that controls things, he takes the famotidine at night.  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  He saw his dentist recently and he had many erosions and his dentist was questioning whether or not he had reflux problems.  I had seen him in 2015 at the time he had a screening colonoscopy that was negative and he also had a negative EGD, he wanted to be screened for Barrett's esophagus.  He is also interested in being screened again or looked at to make sure there is "no damage in there".  He does not have dysphagia or bleeding.  He stopped taking pantoprazole because he read that it could cause bone density decrease. No Known Allergies Current Meds   Medication Sig  . famotidine (PEPCID) 40 MG tablet TAKE 1 TABLET BY MOUTH EVERYDAY AT BEDTIME  . Multiple Vitamin (MULTIVITAMIN) tablet Take 1 tablet by mouth daily. One-A-Day-Take 1 tablet by mouth daily.  . [DISCONTINUED] aspirin 81 MG tablet Take 81 mg by mouth daily.  . [DISCONTINUED] OVER THE COUNTER MEDICATION Calcium-Take 1 tablet by mouth daily.   Past Medical History:  Diagnosis Date  . Elevated LFTs 03/31/2017  . Flat feet   . GERD (gastroesophageal reflux disease)    not currenlty on any meds  . Hyperlipidemia, mild 02/13/2016  . Plantar fasciitis   . Preventative health care 05/03/2011  . Seasonal allergies   . Tachycardia 09/23/2016  . TMJ syndrome    Past Surgical History:  Procedure Laterality Date  . COLONOSCOPY    . ESOPHAGOGASTRODUODENOSCOPY     Social History   Social History Narrative   [employer] Driver   Married x 15 years   One son   2 caffeinated beverages daily   Updated as a 08/07/2013         family history includes Alcohol abuse in his paternal grandfather and paternal uncle; Arthritis in his father; Arthritis/Rheumatoid in his paternal uncle; Atrial fibrillation in his father; Autism in his son; Cancer in his maternal uncle; Dementia in his maternal grandmother; Diabetes in his paternal uncle; Hypertension in his father; Lung cancer in his maternal aunt; Other in his mother; Prostatitis  in his father; Seizures in his son; Stroke in his paternal grandfather.   Review of Systems All other review of systems are negative  Objective:   Physical Exam @BP  108/70   Pulse 66   Ht 6' (1.829 m)   Wt 182 lb 6 oz (82.7 kg)   BMI 24.73 kg/m @  General:  Well-developed, well-nourished and in no acute distress ENT:   Mouth shows teeth with numerous carious defects.  Lungs: Clear to auscultation bilaterally. Heart:   S1S2, no rubs, murmurs, gallops. Abdomen:  soft, non-tender, no hepatosplenomegaly, hernia, or mass and BS+.  Neuro:  A&O x 3.  Psych:    appropriate mood and  Affect.   Data Reviewed:       See HPI

## 2018-06-29 NOTE — Patient Instructions (Signed)
  We have set you up to see Dr Doristine Locks to evaluate you for the TIF procedure.    I appreciate the opportunity to care for you. Stan Head, MD, [facility]

## 2018-07-11 ENCOUNTER — Ambulatory Visit: Payer: BLUE CROSS/BLUE SHIELD | Admitting: Gastroenterology

## 2018-07-11 ENCOUNTER — Encounter: Payer: Self-pay | Admitting: Gastroenterology

## 2018-07-11 VITALS — BP 110/68 | HR 66 | Ht 72.0 in | Wt 185.0 lb

## 2018-07-11 DIAGNOSIS — K219 Gastro-esophageal reflux disease without esophagitis: Secondary | ICD-10-CM | POA: Diagnosis not present

## 2018-07-11 NOTE — Patient Instructions (Signed)
If you are age 56 or older, your body mass index should be between 23-30. Your Body mass index is 25.09 kg/m. If this is out of the aforementioned range listed, please consider follow up with your Primary Care Provider.  If you are age 56 or younger, your body mass index should be between 19-25. Your Body mass index is 25.09 kg/m. If this is out of the aformentioned range listed, please consider follow up with your Primary Care Provider.   You have been scheduled for an endoscopy. Please follow written instructions given to you at your visit today. If you use inhalers (even only as needed), please bring them with you on the day of your procedure. Your physician has requested that you go to www.startemmi.com and enter the access code given to you at your visit today. This web site gives a general overview about your procedure. However, you should still follow specific instructions given to you by our office regarding your preparation for the procedure.  It was a pleasure to see you today!  Vito Cirigliano, D.O.

## 2018-07-11 NOTE — Progress Notes (Addendum)
P  Chief Complaint:    GERD, interested in antireflux procedure  HPI:    Patient is a 58 y.o. male referred by Dr. Leone PayorGessner for evaluation of GERD for possible antireflux procedure.  He was last seen by Dr. Leone PayorGessner on 06/29/2018.  He has a longstanding history of reflux since at least the 1990's, most recently complicated by dental erosions.  Index sxs of HB and regurgitation, worse at night and early morning along with waterbrash in the morning. No dysphagia or odynophagia.   He currently takes famotidine BID which controls his reflux symptoms generally well, with occasional breakthrough symptoms, particularly at night.  He was previously treated with both lansoprazole and pantoprazole but stopped taking due to concerns for decreased bone density among other ADRs.    Drives trucks for a living, working long hours, so difficult to avoid eating outside 3 hours from bedtime.  Otherwise, tries to avoid exacerbating foods.  GERD-HRQL Questionnaire: 30/50  Endoscopic history: -EGD (09/2013, Dr. Leone PayorGessner, for Barrett's screening): Normal  -Colonoscopy (09/2013, Dr. Leone PayorGessner): Mild diverticulosis, otherwise normal.  Recommended repeat in 10 years   Review of systems:     No chest pain, no SOB, no fevers, no urinary sx   Past Medical History:  Diagnosis Date  . Elevated LFTs 03/31/2017  . Flat feet   . GERD (gastroesophageal reflux disease)    not currenlty on any meds  . Hyperlipidemia, mild 02/13/2016  . Plantar fasciitis   . Preventative health care 05/03/2011  . Seasonal allergies   . Tachycardia 09/23/2016  . TMJ syndrome     Patient's surgical history, family medical history, social history, medications and allergies were all reviewed in Epic    Current Outpatient Medications  Medication Sig Dispense Refill  . famotidine (PEPCID) 40 MG tablet TAKE 1 TABLET BY MOUTH EVERYDAY AT BEDTIME 30 tablet 2  . Multiple Vitamin (MULTIVITAMIN) tablet Take 1 tablet by mouth daily.  One-A-Day-Take 1 tablet by mouth daily.     No current facility-administered medications for this visit.     Physical Exam:     Ht 6' (1.829 m)   Wt 185 lb (83.9 kg)   BMI 25.09 kg/m   GENERAL:  Pleasant male in NAD PSYCH: : Cooperative, normal affect EENT:  conjunctiva pink, mucous membranes moist, neck supple without masses CARDIAC:  RRR, no murmur Arts, no peripheral edema PULM: Normal respiratory effort, lungs CTA bilaterally, no wheezing ABDOMEN:  Nondistended, soft, nontender. No obvious masses, no hepatomegaly,  normal bowel sounds SKIN:  turgor, no lesions seen Musculoskeletal:  Normal muscle tone, normal strength NEURO: Alert and oriented x 3, no focal neurologic deficits   IMPRESSION and PLAN:    #1.  GERD: Marc Allen is a 58 y.o. male referred to me to discuss GERD and treatment options. Discussed the pathophysiology of GERD at length, to include the risks of untreated reflux (ie, strictures, Barrett's Esophagus, EAC, etc) as well as the possible treatment with medications vs antireflux surgery. In particular, we discussed the risks, benefits, and alternatives of Transoral Incisionless Fundoplication (TIF), to include Nissen fundoplication and Linx, and the patient wishes to proceed with the pre-operative evaluation for TIF. Will proceed as below:   - EGD to evaluate for objective e/o reflux (ie, reflux esophagitis, SSND Barrett's, etc) and r/o contraindications of TIF (ie, large hiatal hernia, Hill classification 3 or higher, LA Grade C or D esophagitis, EoE, etc) - Resume acid suppression therapy for now  - If necessary, may  need to refer for pH/MII testing or barium esophagram - Discussed the strict post-procedure diet, to include clears x24 hours then liquids x2 weeks and slow advancement to pureed, thick, then finally previous foods 6 weeks post operatively  - Discussed the activity limitations for the initial 6 weeks post operatively  - Directed patient to  informative video regarding the TIF procedure at [email or web address]  - To follow-up with me in the GI clinic following evaluation as above to discuss potential TIF as indicated vs alternate long term treatment strategies - All questions answered  The indications, risks, and benefits of EGD were explained to the patient in detail. Risks include but are not limited to bleeding, perforation, adverse reaction to medications, and cardiopulmonary compromise. Sequelae include but are not limited to the possibility of surgery, hositalization, and mortality. The patient verbalized understanding and wished to proceed. All questions answered, referred to scheduler. Further recommendations pending results of the exam.   I spent a total of 25 minutes of face-to-face time with the patient. Greater than 50% of the time was spent counseling and coordinating care.   Shellia CleverlyVito V Dalten Ambrosino ,DO, FACG 07/11/2018, 3:52 PM

## 2018-07-12 ENCOUNTER — Encounter: Payer: Self-pay | Admitting: Gastroenterology

## 2018-07-20 ENCOUNTER — Telehealth: Payer: Self-pay | Admitting: Gastroenterology

## 2018-07-20 NOTE — Telephone Encounter (Signed)
Pt is scheduled for egd on Friday, he states that he has hay fever and wants to know if he can still haver proc or if he needs to r/s.

## 2018-07-20 NOTE — Telephone Encounter (Signed)
Spoke with pt and informed him it was ok to have the endoscopy as long as he does not have a fever or congestion with difficulty in breathing. He understood.  He will call back if he has further questions. Cherylann Ratel in [facility]

## 2018-07-20 NOTE — Telephone Encounter (Signed)
Will you please address this patient's issues/concernes about upcoming procedure;

## 2018-07-22 ENCOUNTER — Ambulatory Visit (AMBULATORY_SURGERY_CENTER): Payer: BLUE CROSS/BLUE SHIELD | Admitting: Gastroenterology

## 2018-07-22 ENCOUNTER — Encounter: Payer: Self-pay | Admitting: Gastroenterology

## 2018-07-22 VITALS — BP 134/70 | HR 57 | Temp 99.8°F | Resp 22 | Ht 72.0 in | Wt 185.0 lb

## 2018-07-22 DIAGNOSIS — K2100 Gastro-esophageal reflux disease with esophagitis, without bleeding: Secondary | ICD-10-CM

## 2018-07-22 DIAGNOSIS — K269 Duodenal ulcer, unspecified as acute or chronic, without hemorrhage or perforation: Secondary | ICD-10-CM

## 2018-07-22 DIAGNOSIS — K2980 Duodenitis without bleeding: Secondary | ICD-10-CM | POA: Diagnosis not present

## 2018-07-22 MED ORDER — SODIUM CHLORIDE 0.9 % IV SOLN: 500.0000 mL | Freq: Once | INTRAVENOUS | Status: DC

## 2018-07-22 NOTE — Op Note (Signed)
[facility] Patient Name: Marc BarrDavid Burks Procedure Date: 07/22/2018 2:02 PM MRN: [id number] Endoscopist: Doristine LocksVito Berdella Bacot , MD Age: [age] Referring MD:  Date of Birth: 09-06-1960 Gender: Male Account #: [id number] Procedure:                Upper GI endoscopy Indications:              Esophageal reflux, Preoperative assessment                           58 yo male with a longstanding history of reflux                            since at least the 1990's, most recently                            complicated by dental erosions. Index sxs of HB and                            regurgitation, worse at night and early morning                            along with waterbrash in the morning. No dysphagia                            or odynophagia. Currently treated with famotidine                            BID, but presents today for evaluation for possible                            endoscopic antireflux procedure for more definitive                            control of reflux symptoms. Medicines:                Monitored Anesthesia Care Procedure:                Pre-Anesthesia Assessment:                           - Prior to the procedure, a History and Physical                            was performed, and patient medications and                            allergies were reviewed. The patient's tolerance of                            previous anesthesia was also reviewed. The risks                            and benefits of the procedure and the sedation  options and risks were discussed with the patient.                            All questions were answered, and informed consent                            was obtained. Prior Anticoagulants: The patient has                            taken no previous anticoagulant or antiplatelet                            agents. ASA Grade Assessment: II - A patient with                            mild systemic disease. After  reviewing the risks                            and benefits, the patient was deemed in                            satisfactory condition to undergo the procedure.                           After obtaining informed consent, the endoscope was                            passed under direct vision. Throughout the                            procedure, the patient's blood pressure, pulse, and                            oxygen saturations were monitored continuously. The                            Endoscope was introduced through the mouth, and                            advanced to the second part of duodenum. The upper                            GI endoscopy was accomplished without difficulty.                            The patient tolerated the procedure well. Scope In: Scope Out: Findings:                 Esophagogastric landmarks were identified: the                            Z-line was found at 43 cm, the gastroesophageal  junction was found at 43 cm and the site of hiatal                            narrowing was found at 43 cm from the incisors.                           The gastroesophageal flap valve was visualized                            endoscopically and classified as Hill Grade II                            (fold present, opens with respiration).                           LA Grade A (one or more mucosal breaks less than 5                            mm, not extending between tops of 2 mucosal folds)                            esophagitis with no bleeding was found 43 cm from                            the incisors. Biopsies were taken with a cold                            forceps for histologic confirmation of reflux                            changes. Estimated blood loss was minimal.                           The entire examined stomach was normal.                           Few non-bleeding superficial duodenal ulcers with                             no stigmata of bleeding were found in the duodenal                            bulb. The largest lesion was 3 mm in largest                            dimension. Biopsies were taken with a cold forceps                            for histology. Estimated blood loss was minimal.                           Mucosal flattening was found in the second portion  of the duodenum. Biopsies for histology were taken                            with a cold forceps for evaluation of celiac                            disease. Estimated blood loss was minimal. Complications:            No immediate complications. Estimated Blood Loss:     Estimated blood loss was minimal. Impression:               - Esophagogastric landmarks identified as above.                           - Gastroesophageal flap valve classified as Hill                            Grade II (fold present, opens with respiration).                           - LA Grade A reflux esophagitis. Biopsied for                            histologic confirmation of reflux inflammatory                            changes.                           - Normal stomach.                           - Multiple non-bleeding duodenal ulcers with no                            stigmata of bleeding. Biopsied.                           - Flattened mucosa was found in the duodenum.                            Biopsied. Recommendation:           - Patient has a contact number available for                            emergencies. The signs and symptoms of potential                            delayed complications were discussed with the                            patient. Return to normal activities tomorrow.                            Written discharge instructions were provided to the  patient.                           - Resume previous diet today.                           - Continue present medications.                            - Await pathology results.                           - If you would like to discuss endoscopic treatment                            of your acid reflux with Transoral Incisionless                            Fundoplication (TIF), please schedule a follow-up                            appointment with Dr. Barron Alvine. Doristine Locks, MD 07/22/2018 2:25:26 PM

## 2018-07-22 NOTE — Progress Notes (Signed)
Called to room to assist during endoscopic procedure.  Patient ID and intended procedure confirmed with present staff. Received instructions for my participation in the procedure from the performing physician.  

## 2018-07-22 NOTE — Patient Instructions (Signed)
YOU HAD AN ENDOSCOPIC PROCEDURE TODAY AT THE [facility]:   Refer to the procedure report that was given to you for any specific questions about what was found during the examination.  If the procedure report does not answer your questions, please call your gastroenterologist to clarify.  If you requested that your care partner not be given the details of your procedure findings, then the procedure report has been included in a sealed envelope for you to review at your convenience later.  YOU SHOULD EXPECT: Some feelings of bloating in the abdomen. Passage of more gas than usual.  Walking can help get rid of the air that was put into your GI tract during the procedure and reduce the bloating. If you had a lower endoscopy (such as a colonoscopy or flexible sigmoidoscopy) you may notice spotting of blood in your stool or on the toilet paper. If you underwent a bowel prep for your procedure, you may not have a normal bowel movement for a few days.  Please Note:  You might notice some irritation and congestion in your nose or some drainage.  This is from the oxygen used during your procedure.  There is no need for concern and it should clear up in a day or so.  SYMPTOMS TO REPORT IMMEDIATELY:   Following upper endoscopy (EGD)  Vomiting of blood or coffee ground material  New chest pain or pain under the shoulder blades  Painful or persistently difficult swallowing  New shortness of breath  Fever of 100F or higher  Black, tarry-looking stools  For urgent or emergent issues, a gastroenterologist can be reached at any hour by calling [phone].   DIET:  We do recommend a small meal at first, but then you may proceed to your regular diet.  Drink plenty of fluids but you should avoid alcoholic beverages for 24 hours.  ACTIVITY:  You should plan to take it easy for the rest of today and you should NOT DRIVE or use heavy machinery until tomorrow (because of the sedation medicines used  during the test).    FOLLOW UP: Our staff will call the number listed on your records the next business day following your procedure to check on you and address any questions or concerns that you may have regarding the information given to you following your procedure. If we do not reach you, we will leave a message.  However, if you are feeling well and you are not experiencing any problems, there is no need to return our call.  We will assume that you have returned to your regular daily activities without incident.  If any biopsies were taken you will be contacted by phone or by letter within the next 1-3 weeks.  Please call us at [phone] if you have not Resor about the biopsies in 3 weeks.    SIGNATURES/CONFIDENTIALITY: You and/or your care partner have signed paperwork which will be entered into your electronic medical record.  These signatures attest to the fact that that the information above on your After Visit Summary has been reviewed and is understood.  Full responsibility of the confidentiality of this discharge information lies with you and/or your care-partner. 

## 2018-07-22 NOTE — Progress Notes (Signed)
Report given to PACU, vss 

## 2018-07-25 ENCOUNTER — Telehealth: Payer: Self-pay

## 2018-07-25 NOTE — Telephone Encounter (Signed)
  Follow up Call-  Call back number 07/22/2018  Post procedure Call Back phone  # [phone]  Permission to leave phone message Yes  Some recent data might be hidden     Patient questions:  Do you have a fever, pain , or abdominal swelling? No. Pain Score  0 *  Have you tolerated food without any problems? Yes.    Have you been able to return to your normal activities? Yes.    Do you have any questions about your discharge instructions: Diet   No. Medications  No. Follow up visit  No.  Do you have questions or concerns about your Care? No.  Actions: * If pain score is 4 or above: No action needed, pain <4.

## 2018-08-08 IMAGING — DX DG SHOULDER 2+V*L*
3 series · 3 of 3 positions shown · non-contrast
Comparison: None.

CLINICAL DATA: Worsening shoulder pain.

EXAM:
LEFT SHOULDER - 2+ VIEW

[shoulder y view]
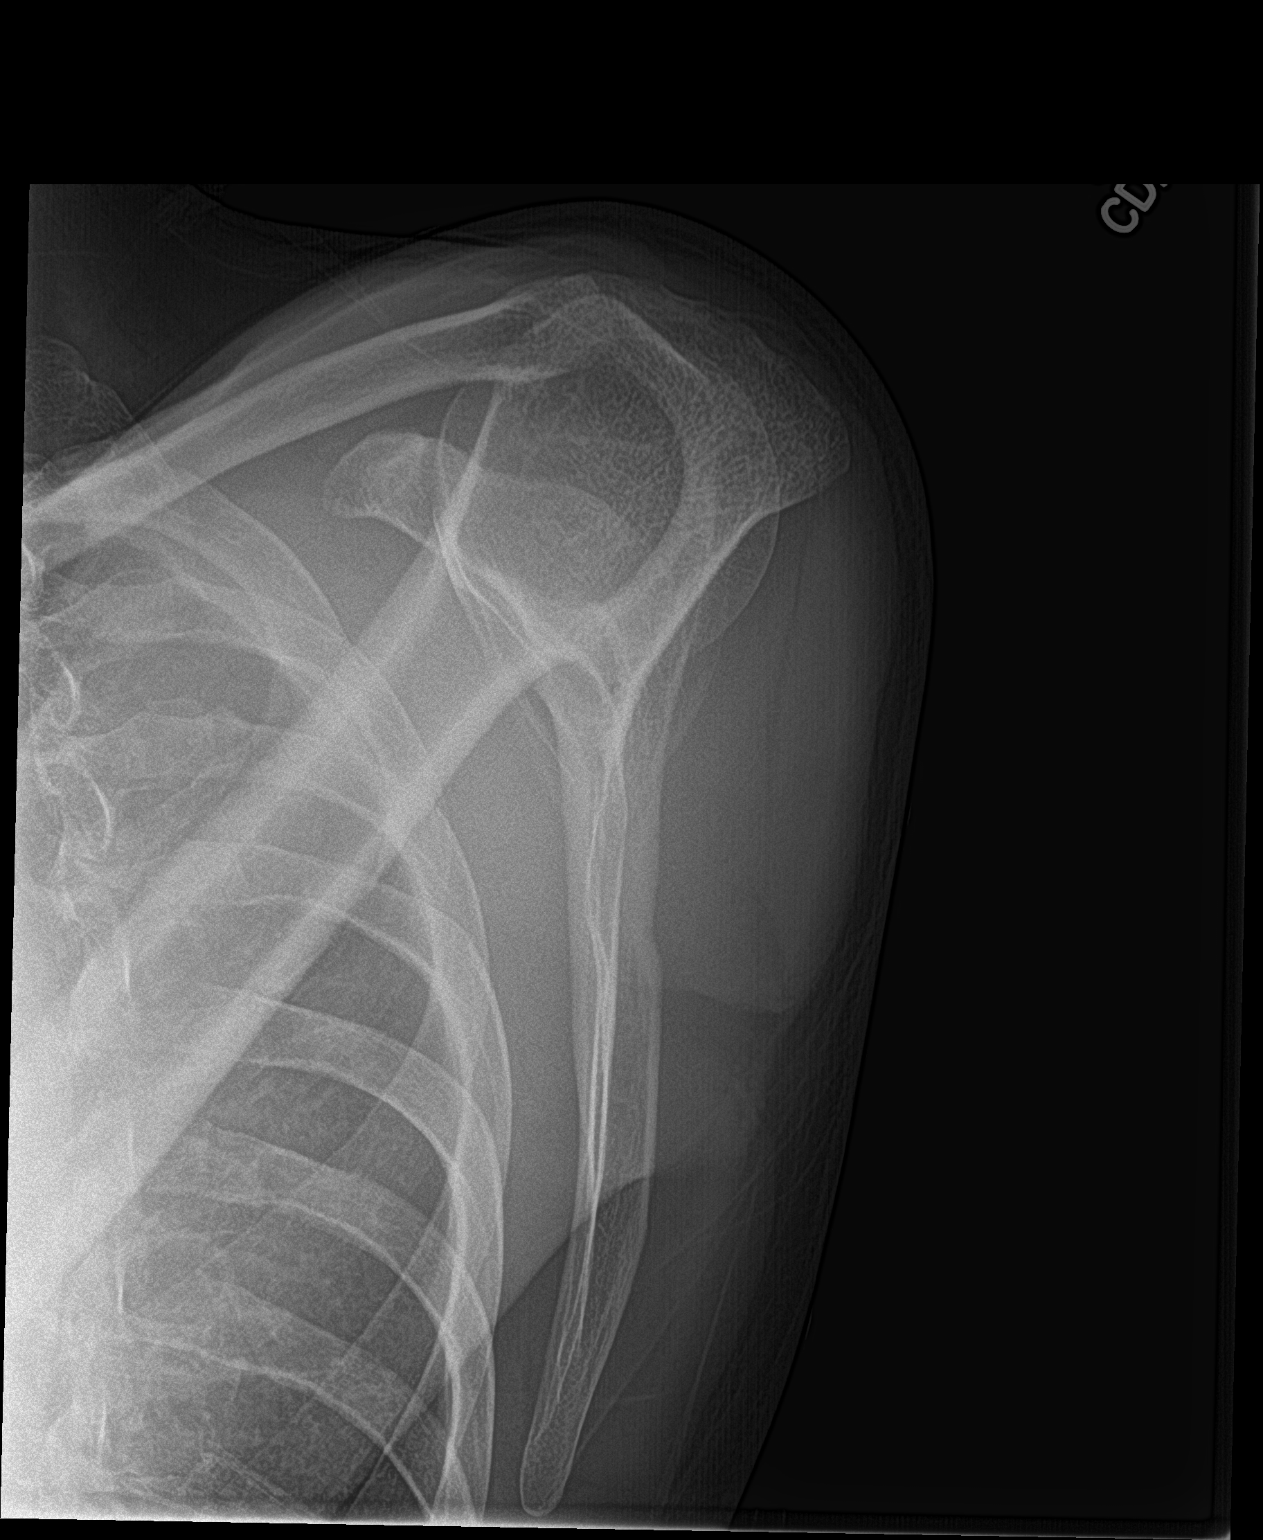

[shoulder axillary]
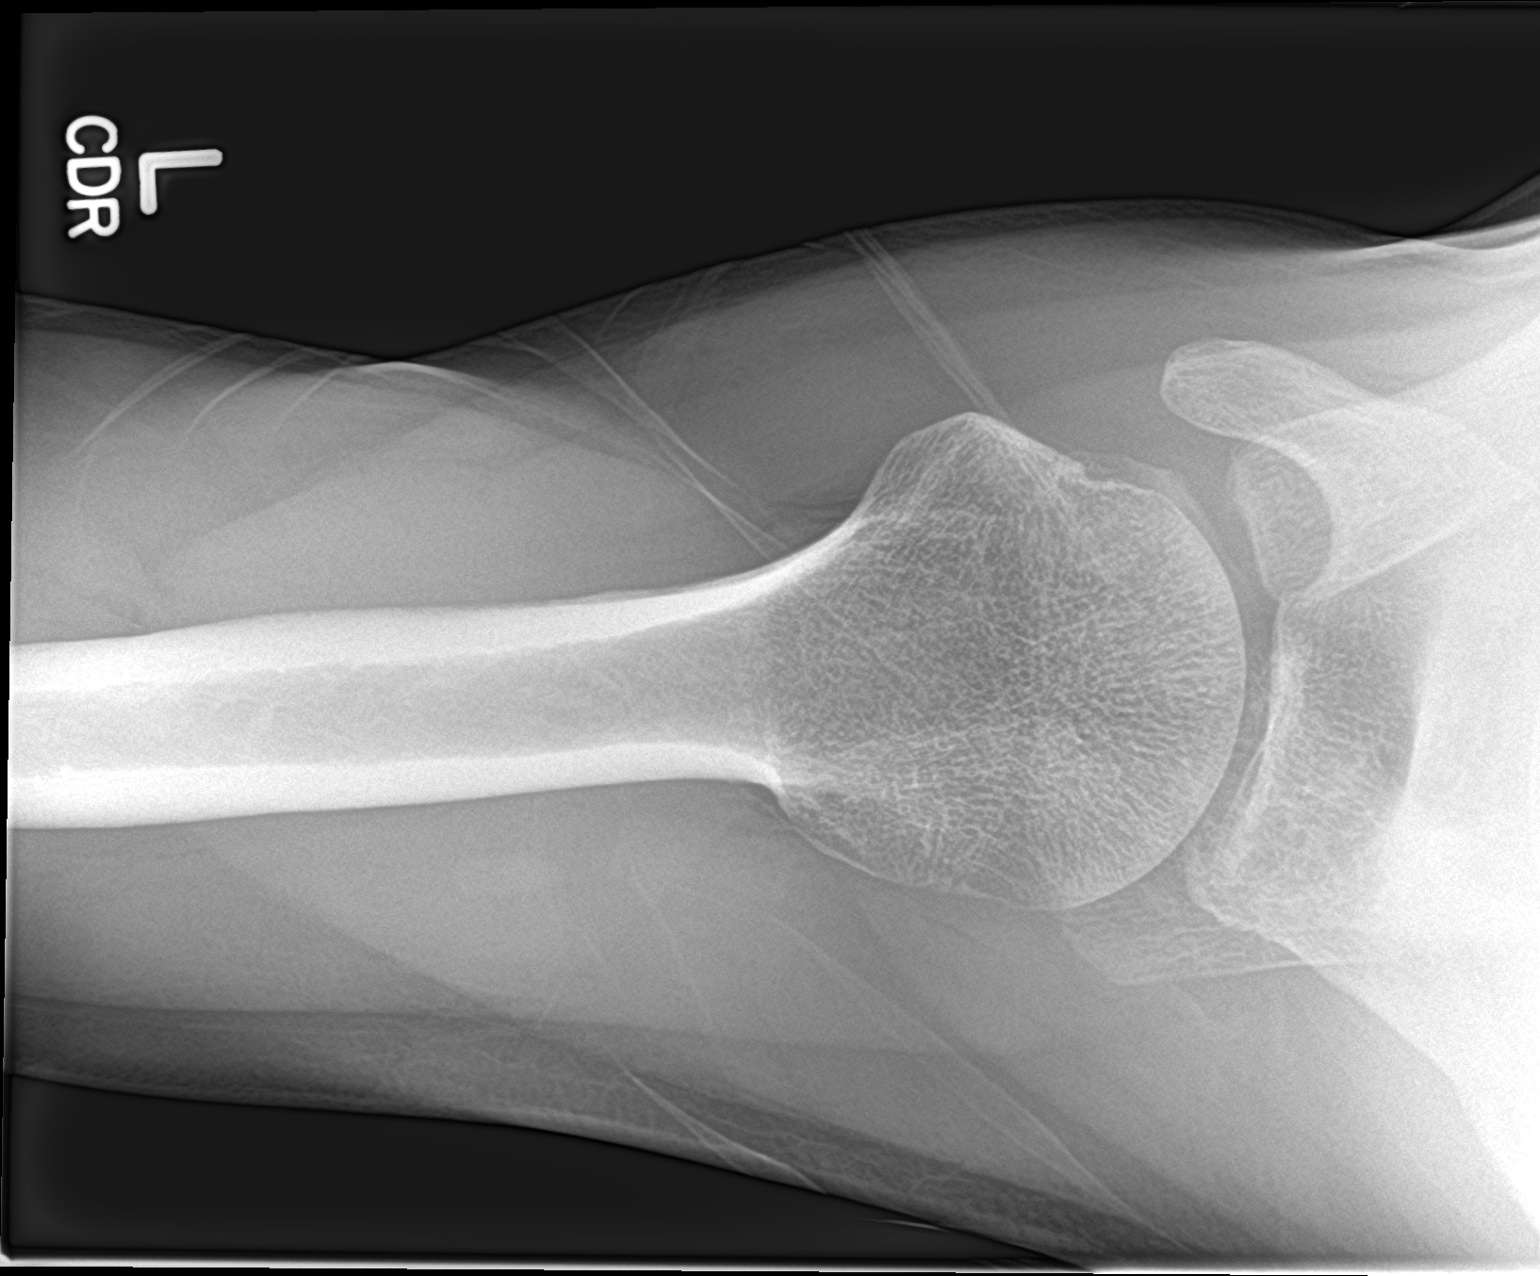

[shoulder grashey]
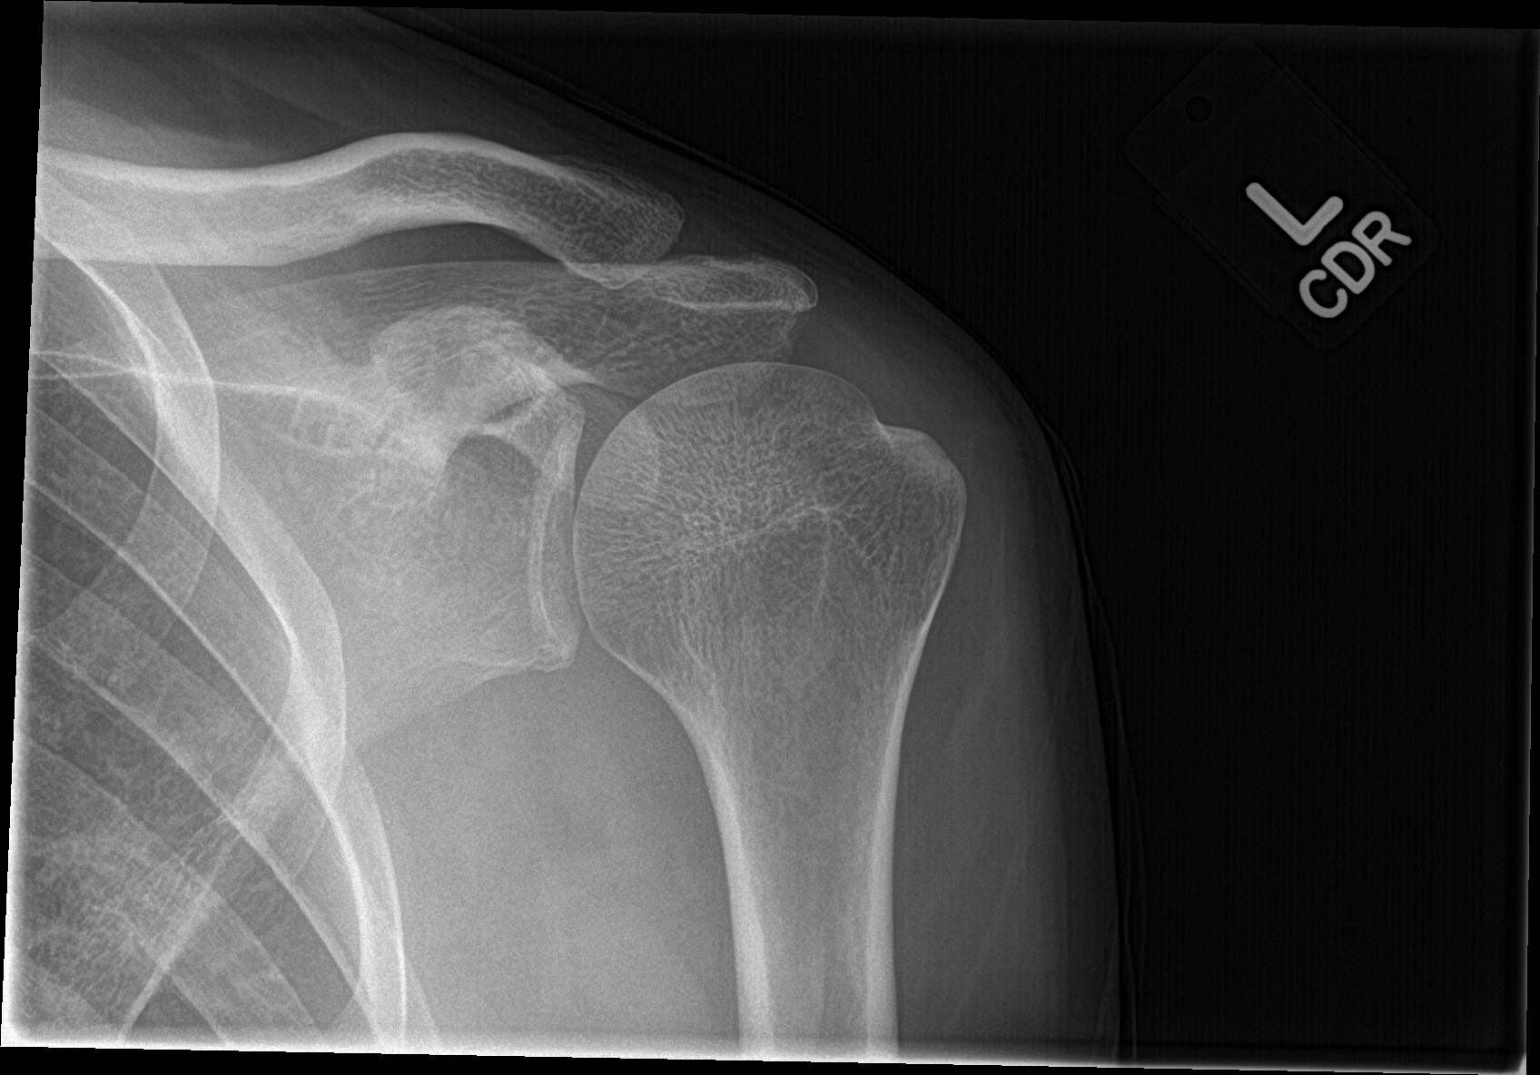

[3 of 3 positions shown; findings below may reference images not displayed]

FINDINGS: There is no evidence of fracture or dislocation. There is no
evidence of arthropathy or other focal bone abnormality. Soft
tissues are unremarkable.
IMPRESSION: Negative.

## 2018-08-23 ENCOUNTER — Ambulatory Visit: Payer: BLUE CROSS/BLUE SHIELD | Admitting: Medical

## 2018-08-23 ENCOUNTER — Encounter: Payer: Self-pay | Admitting: Medical

## 2018-08-23 VITALS — BP 115/77 | HR 71 | Temp 98.1°F | Resp 16 | Ht 72.0 in | Wt 182.0 lb

## 2018-08-23 DIAGNOSIS — R319 Hematuria, unspecified: Secondary | ICD-10-CM

## 2018-08-23 DIAGNOSIS — R350 Frequency of micturition: Secondary | ICD-10-CM

## 2018-08-23 DIAGNOSIS — R509 Fever, unspecified: Secondary | ICD-10-CM | POA: Diagnosis not present

## 2018-08-23 DIAGNOSIS — N419 Inflammatory disease of prostate, unspecified: Secondary | ICD-10-CM

## 2018-08-23 DIAGNOSIS — R300 Dysuria: Secondary | ICD-10-CM | POA: Diagnosis not present

## 2018-08-23 LAB — POC URINALSYSI DIPSTICK (AUTOMATED)
Bilirubin, UA: NEGATIVE
GLUCOSE UA: NEGATIVE
Ketones, UA: NEGATIVE
Nitrite, UA: NEGATIVE
Protein, UA: POSITIVE — AB
Spec Grav, UA: 1.025 (ref 1.010–1.025)
Urobilinogen, UA: 2 E.U./dL — AB
pH, UA: 5.5 (ref 5.0–8.0)

## 2018-08-23 LAB — CBC WITH DIFFERENTIAL/PLATELET
BASOS PCT: 0.3 % (ref 0.0–3.0)
Basophils Absolute: 0 10*3/uL (ref 0.0–0.1)
EOS PCT: 0.4 % (ref 0.0–5.0)
Eosinophils Absolute: 0 10*3/uL (ref 0.0–0.7)
HCT: 43.7 % (ref 39.0–52.0)
Hemoglobin: 14.8 g/dL (ref 13.0–17.0)
Lymphocytes Relative: 11.8 % — ABNORMAL LOW (ref 12.0–46.0)
Lymphs Abs: 1.4 10*3/uL (ref 0.7–4.0)
MCHC: 33.9 g/dL (ref 30.0–36.0)
MCV: 96.5 fl (ref 78.0–100.0)
Monocytes Absolute: 1.2 10*3/uL — ABNORMAL HIGH (ref 0.1–1.0)
Monocytes Relative: 10.2 % (ref 3.0–12.0)
Neutro Abs: 9.2 10*3/uL — ABNORMAL HIGH (ref 1.4–7.7)
Neutrophils Relative %: 77.3 % — ABNORMAL HIGH (ref 43.0–77.0)
Platelets: 242 10*3/uL (ref 150.0–400.0)
RBC: 4.53 Mil/uL (ref 4.22–5.81)
RDW: 13.8 % (ref 11.5–15.5)
WBC: 11.9 10*3/uL — ABNORMAL HIGH (ref 4.0–10.5)

## 2018-08-23 LAB — PSA: PSA: 16.73 ng/mL — ABNORMAL HIGH (ref 0.10–4.00)

## 2018-08-23 MED ORDER — FLUTICASONE PROPIONATE 50 MCG/ACT NA SUSP: 2.0000 | Freq: Every day | NASAL | 1 refills | Status: DC

## 2018-08-23 MED ORDER — CIPROFLOXACIN HCL 500 MG PO TABS: 500.0000 mg | ORAL_TABLET | Freq: Two times a day (BID) | ORAL | 0 refills | Status: DC

## 2018-08-23 NOTE — Patient Instructions (Addendum)
You do appear to have prostatitis with a blood in your urine as well.  Will prescribe Cipro antibiotic and recommend that you hydrate well.  We will send the urine out for culture, get a CBC and PSA lab work today.   With amount of blood present today we will need to follow you closely and repeat urine on follow-up.  Also after reviewing urine culture and PSA results might decide to refer you to a urologist.  Some mild allergy symptoms likely. Rx'd flonase.  Follow-up in 10 to 14 days or as needed.

## 2018-08-23 NOTE — Progress Notes (Signed)
Subjective:    Patient ID: Marc Allen, male    DOB: August 26, 1960, 58 y.o.   MRN: [id number]  HPI  Pt in reporting on Sunday he had aches and chills with some painful urination. States discomfort was at end of the stream. Maybe saw some blood in his urine.  Pt does have some allergies in fall and spring. He had low grade cough the other day as well.  Fever was Sunday, Monday and Tuesday.   Review of Systems  Constitutional: Positive for fever. Negative for chills and fatigue.  HENT: Positive for congestion and postnasal drip. Negative for ear discharge and ear pain.   Respiratory: Positive for cough. Negative for chest tightness, shortness of breath and wheezing.   Cardiovascular: Negative for chest pain and palpitations.  Gastrointestinal: Negative for abdominal pain.  Genitourinary: Positive for dysuria, frequency and urgency.  Musculoskeletal: Negative for back pain.  Skin: Negative for rash.  Hematological: Negative for adenopathy. Does not bruise/bleed easily.  Psychiatric/Behavioral: Negative for behavioral problems, decreased concentration and dysphoric mood.    Past Medical History:  Diagnosis Date  . Elevated LFTs 03/31/2017  . Flat feet   . GERD (gastroesophageal reflux disease)    not currenlty on any meds  . Hyperlipidemia, mild 02/13/2016  . Plantar fasciitis   . Preventative health care 05/03/2011  . Seasonal allergies   . Tachycardia 09/23/2016  . TMJ syndrome      Social History   Socioeconomic History  . Marital status: Married    Spouse name: Not on file  . Number of children: 1  . Years of education: Not on file  . Highest education level: Not on file  Occupational History  . Not on file  Social Needs  . Financial resource strain: Not on file  . Food insecurity:    Worry: Not on file    Inability: Not on file  . Transportation needs:    Medical: Not on file    Non-medical: Not on file  Tobacco Use  . Smoking status: Never Smoker  .  Smokeless tobacco: Never Used  Substance and Sexual Activity  . Alcohol use: No  . Drug use: No  . Sexual activity: Yes    Comment: works at [employer] lives with wife, no dietary restrictions  Lifestyle  . Physical activity:    Days per week: Not on file    Minutes per session: Not on file  . Stress: Not on file  Relationships  . Social connections:    Talks on phone: Not on file    Gets together: Not on file    Attends religious service: Not on file    Active member of club or organization: Not on file    Attends meetings of clubs or organizations: Not on file    Relationship status: Not on file  . Intimate partner violence:    Fear of current or ex partner: Not on file    Emotionally abused: Not on file    Physically abused: Not on file    Forced sexual activity: Not on file  Other Topics Concern  . Not on file  Social History Narrative   [employer]   Married x 15 years   One son   2 caffeinated beverages daily   Never smoker   No alcohol or drug use or other tobacco       Past Surgical History:  Procedure Laterality Date  . COLONOSCOPY    . ESOPHAGOGASTRODUODENOSCOPY  Family History  Problem Relation Age of Onset  . Alcohol abuse Paternal Grandfather   . Stroke Paternal Grandfather   . Hypertension Father   . Prostatitis Father   . Atrial fibrillation Father   . Arthritis Father        DDD  . Autism Son   . Dementia Maternal Grandmother   . Other Mother        Died in car accident  . Alcohol abuse Paternal Uncle   . Arthritis/Rheumatoid Paternal Uncle   . Diabetes Paternal Uncle   . Lung cancer Maternal Aunt        smoker  . Seizures Son        age 43  . Cancer Maternal Uncle        bone cancer, smoker  . Colon cancer Neg Hx   . Prostate cancer Neg Hx   . Rectal cancer Neg Hx   . Stomach cancer Neg Hx   . Esophageal cancer Neg Hx     Allergies  Allergen Reactions  . Other     Hayfever    Current Outpatient Medications on File Prior  to Visit  Medication Sig Dispense Refill  . famotidine (PEPCID) 40 MG tablet TAKE 1 TABLET BY MOUTH EVERYDAY AT BEDTIME 30 tablet 2   No current facility-administered medications on file prior to visit.     BP 115/77   Pulse 71   Temp 98.1 F (36.7 C) (Oral)   Resp 16   Ht 6' (1.829 m)   Wt 182 lb (82.6 kg)   SpO2 100%   BMI 24.68 kg/m        Objective:   Physical Exam   General  Mental Status - Alert. General Appearance - Well groomed. Not in acute distress.  Skin Rashes- No Rashes.  HEENT Head- Normal. Ear Auditory Canal - Left- Normal. Right - Normal.Tympanic Membrane- Left- Normal. Right- Normal. Eye Sclera/Conjunctiva- Left- Normal. Right- Normal. Nose & Sinuses Nasal Mucosa- Left-  Boggy and Congested. Right-  Boggy and  Congested.Bilateral  No maxillary and no  frontal sinus pressure. Mouth & Throat Lips: Upper Lip- Normal: no dryness, cracking, pallor, cyanosis, or vesicular eruption. Lower Lip-Normal: no dryness, cracking, pallor, cyanosis or vesicular eruption. Buccal Mucosa- Bilateral- No Aphthous ulcers. Oropharynx- No Discharge or Erythema. Tonsils: Characteristics- Bilateral- No Erythema or Congestion. Size/Enlargement- Bilateral- No enlargement. Discharge- bilateral-None.  Neck Neck- Supple. No Masses.  Chest and Lung Exam Auscultation: Breath Sounds:-Clear even and unlabored.  Cardiovascular Auscultation:Rythm- Regular, rate and rhythm. Murmurs & Other Heart Sounds:Ausculatation of the heart reveal- No Murmurs.  Lymphatic Head & Neck General Head & Neck Lymphatics: Bilateral: Description- No Localized lymphadenopathy.  Abdomen- soft, faint tender suprapubic area and around navel, non distended, +bs, no rebound or guarding. Faint suprapubic tenderness.   Back- no cva tenderness.       Assessment & Plan:  You do appear to have prostatitis with a blood in your urine as well.  Will prescribe Cipro antibiotic and recommend that you  hydrate well.  We will send the urine out for culture, get a CBC and PSA lab work today.   With amount of blood present today we will need to follow you closely and repeat urine on follow-up.  Also after reviewing urine culture and PSA results might decide to refer you to a urologist.  Some mild allergy symptoms likley. Rx'd flonase.  Follow-up in 10 to 14 days or as needed.  Esperanza Richters, PA-C

## 2018-08-25 LAB — URINE CULTURE
MICRO NUMBER: 299536
SPECIMEN QUALITY:: ADEQUATE

## 2018-09-06 ENCOUNTER — Other Ambulatory Visit: Payer: Self-pay

## 2018-09-06 ENCOUNTER — Ambulatory Visit (INDEPENDENT_AMBULATORY_CARE_PROVIDER_SITE_OTHER): Payer: BLUE CROSS/BLUE SHIELD | Admitting: Family Medicine

## 2018-09-06 DIAGNOSIS — R7989 Other specified abnormal findings of blood chemistry: Secondary | ICD-10-CM

## 2018-09-06 DIAGNOSIS — R945 Abnormal results of liver function studies: Secondary | ICD-10-CM

## 2018-09-06 DIAGNOSIS — N419 Inflammatory disease of prostate, unspecified: Secondary | ICD-10-CM | POA: Diagnosis not present

## 2018-09-07 NOTE — Assessment & Plan Note (Signed)
Feels much better after antibiotic treatment. Will have him repeat labs including PSA, CMO, CBC diff

## 2018-09-07 NOTE — Progress Notes (Signed)
Virtual Visit via Video Note  I connected with Marc Allen on 09/07/18 at  5:15 PM EDT by a video enabled telemedicine application and verified that I am speaking with the correct person using two identifiers.   I discussed the limitations of evaluation and management by telemedicine and the availability of in person appointments. The patient expressed understanding and agreed to proceed. Marc Allen CMA was able to get the patient on the video visitl  History of Present Illness: Patient is seen today from the office via video visit with patient at home. He is in need of follow up on his recent prostatitis. He denies any persistent symptoms such as polyuria or dysuria. No abdominal or pelvic pain. No fevers or chills. He feels ell today. No recent febrile illness or hospitalization.     Observations/Objective: Patient Alert and Oriented x 3, NAD, normal mood and behavior.    Assessment and Plan: Prostatitis Feels much better after antibiotic treatment. Will have him repeat labs including PSA, CMO, CBC diff  Elevated LFTs Repeat cmp today    Follow Up Instructions:    I discussed the assessment and treatment plan with the patient. The patient was provided an opportunity to ask questions and all were answered. The patient agreed with the plan and demonstrated an understanding of the instructions.   The patient was advised to call back or seek an in-person evaluation if the symptoms worsen or if the condition fails to improve as anticipated.  I provided 13 minutes of non-face-to-face time during this encounter.   Danise Edge, MD

## 2018-09-07 NOTE — Assessment & Plan Note (Signed)
Repeat cmp today 

## 2018-09-14 ENCOUNTER — Other Ambulatory Visit: Payer: Self-pay | Admitting: Medical

## 2018-09-19 ENCOUNTER — Encounter (HOSPITAL_COMMUNITY): Admission: RE | Payer: Self-pay | Source: Home / Self Care

## 2018-09-19 ENCOUNTER — Ambulatory Visit (HOSPITAL_COMMUNITY)
Admission: RE | Admit: 2018-09-19 | Payer: BLUE CROSS/BLUE SHIELD | Source: Home / Self Care | Admitting: Gastroenterology

## 2018-09-19 SURGERY — FUNDOPLICATION, STOMACH, INCISIONLESS, ORAL APPROACH
Anesthesia: General

## 2018-09-22 ENCOUNTER — Telehealth: Payer: Self-pay

## 2018-09-22 NOTE — Telephone Encounter (Signed)
Copied from CRM [id number]. Topic: General - Other >> Sep 12, 2018  3:41 PM Marc Allen, NT wrote: Reason for CRM: Patient called in regards to having lab work done. Patient stated that Marc Allen was going to take care of this encounter for him. Requesting a call back regarding this at [phone].

## 2018-09-27 NOTE — Telephone Encounter (Signed)
Called patient to discuss lab work

## 2018-10-10 ENCOUNTER — Other Ambulatory Visit: Payer: Self-pay | Admitting: Family Medicine

## 2018-10-10 DIAGNOSIS — R7989 Other specified abnormal findings of blood chemistry: Secondary | ICD-10-CM

## 2018-10-10 DIAGNOSIS — E785 Hyperlipidemia, unspecified: Secondary | ICD-10-CM

## 2018-10-10 DIAGNOSIS — R945 Abnormal results of liver function studies: Principal | ICD-10-CM

## 2018-10-10 DIAGNOSIS — R000 Tachycardia, unspecified: Secondary | ICD-10-CM

## 2018-10-10 DIAGNOSIS — R300 Dysuria: Secondary | ICD-10-CM

## 2018-10-11 ENCOUNTER — Other Ambulatory Visit: Payer: Self-pay

## 2018-10-11 ENCOUNTER — Ambulatory Visit: Payer: BLUE CROSS/BLUE SHIELD | Admitting: Family Medicine

## 2018-10-11 ENCOUNTER — Other Ambulatory Visit (INDEPENDENT_AMBULATORY_CARE_PROVIDER_SITE_OTHER): Payer: BLUE CROSS/BLUE SHIELD

## 2018-10-11 DIAGNOSIS — E785 Hyperlipidemia, unspecified: Secondary | ICD-10-CM

## 2018-10-11 DIAGNOSIS — R000 Tachycardia, unspecified: Secondary | ICD-10-CM

## 2018-10-11 DIAGNOSIS — R300 Dysuria: Secondary | ICD-10-CM

## 2018-10-11 DIAGNOSIS — R945 Abnormal results of liver function studies: Secondary | ICD-10-CM | POA: Diagnosis not present

## 2018-10-11 DIAGNOSIS — R7989 Other specified abnormal findings of blood chemistry: Secondary | ICD-10-CM

## 2018-10-12 LAB — URINE CULTURE
MICRO NUMBER:: 428126
Result:: NO GROWTH
SPECIMEN QUALITY:: ADEQUATE

## 2018-10-12 LAB — CBC WITH DIFFERENTIAL/PLATELET
Basophils Absolute: 0.1 10*3/uL (ref 0.0–0.1)
Basophils Relative: 1.2 % (ref 0.0–3.0)
Eosinophils Absolute: 0.1 10*3/uL (ref 0.0–0.7)
Eosinophils Relative: 1.2 % (ref 0.0–5.0)
HCT: 43 % (ref 39.0–52.0)
Hemoglobin: 14.8 g/dL (ref 13.0–17.0)
Lymphocytes Relative: 35.2 % (ref 12.0–46.0)
Lymphs Abs: 1.8 10*3/uL (ref 0.7–4.0)
MCHC: 34.4 g/dL (ref 30.0–36.0)
MCV: 96.9 fl (ref 78.0–100.0)
Monocytes Absolute: 0.5 10*3/uL (ref 0.1–1.0)
Monocytes Relative: 9.4 % (ref 3.0–12.0)
Neutro Abs: 2.8 10*3/uL (ref 1.4–7.7)
Neutrophils Relative %: 53 % (ref 43.0–77.0)
Platelets: 280 10*3/uL (ref 150.0–400.0)
RBC: 4.44 Mil/uL (ref 4.22–5.81)
RDW: 13.9 % (ref 11.5–15.5)
WBC: 5.2 10*3/uL (ref 4.0–10.5)

## 2018-10-12 LAB — URINALYSIS, ROUTINE W REFLEX MICROSCOPIC
Bilirubin Urine: NEGATIVE
Hgb urine dipstick: NEGATIVE
Ketones, ur: NEGATIVE
Nitrite: NEGATIVE
RBC / HPF: NONE SEEN (ref 0–?)
Specific Gravity, Urine: 1.025 (ref 1.000–1.030)
Total Protein, Urine: NEGATIVE
Urine Glucose: NEGATIVE
Urobilinogen, UA: 0.2 (ref 0.0–1.0)
pH: 6 (ref 5.0–8.0)

## 2018-10-12 LAB — COMPREHENSIVE METABOLIC PANEL
ALT: 21 U/L (ref 0–53)
AST: 20 U/L (ref 0–37)
Albumin: 4.4 g/dL (ref 3.5–5.2)
Alkaline Phosphatase: 63 U/L (ref 39–117)
BUN: 7 mg/dL (ref 6–23)
CO2: 27 mEq/L (ref 19–32)
Calcium: 9 mg/dL (ref 8.4–10.5)
Chloride: 105 mEq/L (ref 96–112)
Creatinine, Ser: 0.88 mg/dL (ref 0.40–1.50)
GFR: 108.61 mL/min (ref >60.00)
Glucose, Bld: 82 mg/dL (ref 70–99)
Potassium: 4 mEq/L (ref 3.5–5.1)
Sodium: 140 mEq/L (ref 135–145)
Total Bilirubin: 0.5 mg/dL (ref 0.2–1.2)
Total Protein: 6.9 g/dL (ref 6.0–8.3)

## 2018-10-12 LAB — LIPID PANEL
Cholesterol: 178 mg/dL (ref 0–200)
HDL: 51.8 mg/dL (ref >39.00)
LDL Cholesterol: 106 mg/dL — ABNORMAL HIGH (ref 0–99)
NonHDL: 126.06
Total CHOL/HDL Ratio: 3
Triglycerides: 100 mg/dL (ref 0.0–149.0)
VLDL: 20 mg/dL (ref 0.0–40.0)

## 2018-10-12 LAB — PSA: PSA: 3.41 ng/mL (ref 0.10–4.00)

## 2018-10-12 LAB — TSH: TSH: 1.13 u[IU]/mL (ref 0.35–4.50)

## 2018-10-17 ENCOUNTER — Other Ambulatory Visit: Payer: Self-pay

## 2018-10-17 ENCOUNTER — Ambulatory Visit (INDEPENDENT_AMBULATORY_CARE_PROVIDER_SITE_OTHER): Payer: BLUE CROSS/BLUE SHIELD | Admitting: Family Medicine

## 2018-10-17 DIAGNOSIS — K219 Gastro-esophageal reflux disease without esophagitis: Secondary | ICD-10-CM | POA: Diagnosis not present

## 2018-10-17 DIAGNOSIS — E785 Hyperlipidemia, unspecified: Secondary | ICD-10-CM

## 2018-10-17 DIAGNOSIS — R945 Abnormal results of liver function studies: Secondary | ICD-10-CM | POA: Diagnosis not present

## 2018-10-17 DIAGNOSIS — N419 Inflammatory disease of prostate, unspecified: Secondary | ICD-10-CM | POA: Diagnosis not present

## 2018-10-17 DIAGNOSIS — K449 Diaphragmatic hernia without obstruction or gangrene: Secondary | ICD-10-CM

## 2018-10-17 DIAGNOSIS — R7989 Other specified abnormal findings of blood chemistry: Secondary | ICD-10-CM

## 2018-10-17 NOTE — Patient Instructions (Addendum)
Vitamin C 1000 mg 3 x a day  Zinc 50 mg daily  Sodium Bicarbonate (baking soda) 1/4 tsp  3-6 x a day when you feel ill  Omron BP cuff for upper arm  Pulse Oximeter

## 2018-10-18 NOTE — Progress Notes (Signed)
Virtual Visit via Video Note  I connected with [name] on 10/18/18 at  3:40 PM EDT by a video enabled telemedicine application and verified that I am speaking with the correct person using two identifiers.  Location: Patient: work Provider: home   I discussed the limitations of evaluation and management by telemedicine and the availability of in person appointments. The patient expressed understanding and agreed to proceed. [name] was able to get the patient set up on video visit.     Subjective:    Patient ID: Marc Allen, male    DOB: [birth year], 58 y.o.   MRN: [id number]  No chief complaint on file.   HPI Patient is in today for follow up on prostatitis, hyperlipidemia and reflux. He feels well today. He denies any persistent pain or urinary symptoms. No recent febrile illness or hospitalizations. He is trying to eat well and stay active. Denies CP/palp/SOB/HA/congestion/fevers/GI or GU c/o. Taking meds as prescribed  Past Medical History:  Diagnosis Date  . Elevated LFTs 03/31/2017  . Flat feet   . GERD (gastroesophageal reflux disease)    not currenlty on any meds  . Hyperlipidemia, mild 02/13/2016  . Plantar fasciitis   . Preventative health care 05/03/2011  . Seasonal allergies   . Tachycardia 09/23/2016  . TMJ syndrome     Past Surgical History:  Procedure Laterality Date  . COLONOSCOPY    . ESOPHAGOGASTRODUODENOSCOPY      Family History  Problem Relation Age of Onset  . Alcohol abuse Paternal Grandfather   . Stroke Paternal Grandfather   . Hypertension Father   . Prostatitis Father   . Atrial fibrillation Father   . Arthritis Father        DDD  . Autism Son   . Dementia Maternal Grandmother   . Other Mother        Died in car accident  . Alcohol abuse Paternal Uncle   . Arthritis/Rheumatoid Paternal Uncle   . Diabetes Paternal Uncle   . Lung cancer Maternal Aunt        smoker  . Seizures Son        age 75  . Cancer Maternal  Uncle        bone cancer, smoker  . Colon cancer Neg Hx   . Prostate cancer Neg Hx   . Rectal cancer Neg Hx   . Stomach cancer Neg Hx   . Esophageal cancer Neg Hx     Social History   Socioeconomic History  . Marital status: Married    Spouse name: Not on file  . Number of children: 1  . Years of education: Not on file  . Highest education level: Not on file  Occupational History  . Not on file  Social Needs  . Financial resource strain: Not on file  . Food insecurity:    Worry: Not on file    Inability: Not on file  . Transportation needs:    Medical: Not on file    Non-medical: Not on file  Tobacco Use  . Smoking status: Never Smoker  . Smokeless tobacco: Never Used  Substance and Sexual Activity  . Alcohol use: No  . Drug use: No  . Sexual activity: Yes    Comment: works at [employer] lives with wife, no dietary restrictions  Lifestyle  . Physical activity:    Days per week: Not on file    Minutes per session: Not on file  . Stress: Not on file  Relationships  . Social connections:    Talks on phone: Not on file    Gets together: Not on file    Attends religious service: Not on file    Active member of club or organization: Not on file    Attends meetings of clubs or organizations: Not on file    Relationship status: Not on file  . Intimate partner violence:    Fear of current or ex partner: Not on file    Emotionally abused: Not on file    Physically abused: Not on file    Forced sexual activity: Not on file  Other Topics Concern  . Not on file  Social History Narrative   [employer]   Married x 15 years   One son   2 caffeinated beverages daily   Never smoker   No alcohol or drug use or other tobacco       Outpatient Medications Prior to Visit  Medication Sig Dispense Refill  . famotidine (PEPCID) 40 MG tablet TAKE 1 TABLET BY MOUTH EVERYDAY AT BEDTIME (Patient taking differently: Take 40 mg by mouth at bedtime. ) 30 tablet 2  . fluticasone  (FLONASE) 50 MCG/ACT nasal spray SPRAY 2 SPRAYS INTO EACH NOSTRIL EVERY DAY 16 g 1   No facility-administered medications prior to visit.     Allergies  Allergen Reactions  . Other     Hayfever    Review of Systems  Constitutional: Negative for fever and malaise/fatigue.  HENT: Negative for congestion.   Eyes: Negative for blurred vision.  Respiratory: Negative for shortness of breath.   Cardiovascular: Negative for chest pain, palpitations and leg swelling.  Gastrointestinal: Negative for abdominal pain, blood in stool and nausea.  Genitourinary: Negative for dysuria and frequency.  Musculoskeletal: Negative for falls.  Skin: Negative for rash.  Neurological: Negative for dizziness, loss of consciousness and headaches.  Endo/Heme/Allergies: Negative for environmental allergies.  Psychiatric/Behavioral: Negative for depression. The patient is not nervous/anxious.        Objective:    Physical Exam Constitutional:      Appearance: Normal appearance. He is not ill-appearing.  HENT:     Head: Normocephalic and atraumatic.     Nose: Nose normal.  Pulmonary:     Effort: Pulmonary effort is normal.  Neurological:     Mental Status: He is alert and oriented to person, place, and time.  Psychiatric:        Mood and Affect: Mood normal.        Behavior: Behavior normal.     There were no vitals taken for this visit. Wt Readings from Last 3 Encounters:  08/23/18 182 lb (82.6 kg)  07/22/18 185 lb (83.9 kg)  07/11/18 185 lb (83.9 kg)    Diabetic Foot Exam - Simple   No data filed     Lab Results  Component Value Date   WBC 5.2 10/11/2018   HGB 14.8 10/11/2018   HCT 43.0 10/11/2018   PLT 280.0 10/11/2018   GLUCOSE 82 10/11/2018   CHOL 178 10/11/2018   TRIG 100.0 10/11/2018   HDL 51.80 10/11/2018   LDLCALC 106 (H) 10/11/2018   ALT 21 10/11/2018   AST 20 10/11/2018   NA 140 10/11/2018   K 4.0 10/11/2018   CL 105 10/11/2018   CREATININE 0.88 10/11/2018   BUN 7  10/11/2018   CO2 27 10/11/2018   TSH 1.13 10/11/2018   PSA 3.41 10/11/2018   HGBA1C 5.5 06/20/2012    Lab  Results  Component Value Date   TSH 1.13 10/11/2018   Lab Results  Component Value Date   WBC 5.2 10/11/2018   HGB 14.8 10/11/2018   HCT 43.0 10/11/2018   MCV 96.9 10/11/2018   PLT 280.0 10/11/2018   Lab Results  Component Value Date   NA 140 10/11/2018   K 4.0 10/11/2018   CO2 27 10/11/2018   GLUCOSE 82 10/11/2018   BUN 7 10/11/2018   CREATININE 0.88 10/11/2018   BILITOT 0.5 10/11/2018   ALKPHOS 63 10/11/2018   AST 20 10/11/2018   ALT 21 10/11/2018   PROT 6.9 10/11/2018   ALBUMIN 4.4 10/11/2018   CALCIUM 9.0 10/11/2018   GFR 108.61 10/11/2018   Lab Results  Component Value Date   CHOL 178 10/11/2018   Lab Results  Component Value Date   HDL 51.80 10/11/2018   Lab Results  Component Value Date   LDLCALC 106 (H) 10/11/2018   Lab Results  Component Value Date   TRIG 100.0 10/11/2018   Lab Results  Component Value Date   CHOLHDL 3 10/11/2018   Lab Results  Component Value Date   HGBA1C 5.5 06/20/2012       Assessment & Plan:   Problem List Items Addressed This Visit    Hiatal hernia with gastroesophageal reflux    Doing well with Famotidine prn      Prostatitis    PSA and WBC now normal and he feels much better.       Hyperlipidemia, mild    Encouraged heart healthy diet, increase exercise, avoid trans fats      Elevated LFTs    Resolved. Minimize carbohydrates and monitor         I am having Candace CruiseDavid F. Arreguin maintain his famotidine and fluticasone.  No orders of the defined types were placed in this encounter.  I discussed the assessment and treatment plan with the patient. The patient was provided an opportunity to ask questions and all were answered. The patient agreed with the plan and demonstrated an understanding of the instructions.   The patient was advised to call back or seek an in-person evaluation if the symptoms  worsen or if the condition fails to improve as anticipated.  I provided 25 minutes of non-face-to-face time during this encounter.   Danise EdgeStacey Jayd Cadieux, MD

## 2018-10-18 NOTE — Assessment & Plan Note (Signed)
Encouraged heart healthy diet, increase exercise, avoid trans fats 

## 2018-10-18 NOTE — Assessment & Plan Note (Signed)
Doing well with Famotidine prn

## 2018-10-18 NOTE — Assessment & Plan Note (Signed)
PSA and WBC now normal and he feels much better.

## 2018-10-18 NOTE — Assessment & Plan Note (Signed)
Resolved. Minimize carbohydrates and monitor

## 2018-10-20 ENCOUNTER — Other Ambulatory Visit: Payer: Self-pay | Admitting: Family Medicine

## 2018-10-29 ENCOUNTER — Other Ambulatory Visit: Payer: Self-pay | Admitting: Family Medicine

## 2019-01-17 ENCOUNTER — Telehealth: Payer: Self-pay | Admitting: Gastroenterology

## 2019-01-17 NOTE — Telephone Encounter (Signed)
Pt needs to schedule procedure, he did not remember the name of the procedure just stated that it needs to be at the hospital.

## 2019-01-17 NOTE — Telephone Encounter (Signed)
Called and spoke with patient-patient is requesting to know if he can be scheduled for his TIF procedure; please advise if the plan is a go for this on 02/14/2019 at [facility]?

## 2019-01-18 ENCOUNTER — Other Ambulatory Visit: Payer: Self-pay

## 2019-01-18 DIAGNOSIS — K269 Duodenal ulcer, unspecified as acute or chronic, without hemorrhage or perforation: Secondary | ICD-10-CM

## 2019-01-18 DIAGNOSIS — K2100 Gastro-esophageal reflux disease with esophagitis, without bleeding: Secondary | ICD-10-CM

## 2019-01-18 NOTE — Telephone Encounter (Signed)
Yes, his TIF was cancelled in April d/t COVID, and he can be rescheduled for TIF on 9/1 at [facility]. Please place in a 1 hour block. Please also make a note and contact the [facility] staff/Anesthesia that this will be a planned general anesthesia case with paralysis followed by overnight admission for 23-hour observation (as all TIF cases are).   When entering his case, will need to select the TIF pre-op order set to administer pre-op anti-emetics and intra-op Abx, steroids, etc. Thanks.

## 2019-01-18 NOTE — Telephone Encounter (Signed)
Patient has been scheduled for COVID pre procedure screening on 02/10/2019 at 3:45pm;  Patient has also been scheduled for TIF procedure on 02/14/2019 at 10:45am per MD recommendations; patient will be mailed prep instructions and was advised to call back to the office should questions/concerns arise;

## 2019-01-18 NOTE — Telephone Encounter (Signed)
Please advise which antibx does this patient need intraop?

## 2019-02-10 ENCOUNTER — Other Ambulatory Visit (HOSPITAL_COMMUNITY)
Admission: RE | Admit: 2019-02-10 | Discharge: 2019-02-10 | Disposition: A | Discharge status: Home / Self Care | Payer: BC Managed Care – PPO | Source: Ambulatory Visit | Attending: Gastroenterology | Admitting: Gastroenterology

## 2019-02-10 ENCOUNTER — Other Ambulatory Visit (HOSPITAL_COMMUNITY): Payer: BLUE CROSS/BLUE SHIELD

## 2019-02-10 DIAGNOSIS — Z20828 Contact with and (suspected) exposure to other viral communicable diseases: Secondary | ICD-10-CM | POA: Insufficient documentation

## 2019-02-10 DIAGNOSIS — Z01812 Encounter for preprocedural laboratory examination: Secondary | ICD-10-CM | POA: Insufficient documentation

## 2019-02-10 LAB — SARS CORONAVIRUS 2 (TAT 6-24 HRS): SARS Coronavirus 2: NEGATIVE

## 2019-02-13 ENCOUNTER — Other Ambulatory Visit (HOSPITAL_COMMUNITY): Payer: BC Managed Care – PPO

## 2019-02-13 ENCOUNTER — Encounter (HOSPITAL_COMMUNITY): Payer: Self-pay | Admitting: *Deleted

## 2019-02-13 ENCOUNTER — Other Ambulatory Visit: Payer: Self-pay

## 2019-02-14 ENCOUNTER — Ambulatory Visit (HOSPITAL_COMMUNITY): Payer: BC Managed Care – PPO | Admitting: Anesthesiology

## 2019-02-14 ENCOUNTER — Observation Stay (HOSPITAL_COMMUNITY)
Admission: RE | Admit: 2019-02-14 | Discharge: 2019-02-15 | Disposition: A | Discharge status: Home / Self Care | Payer: BC Managed Care – PPO | Attending: Gastroenterology | Admitting: Gastroenterology

## 2019-02-14 ENCOUNTER — Encounter (HOSPITAL_COMMUNITY): Payer: Self-pay | Admitting: *Deleted

## 2019-02-14 ENCOUNTER — Encounter (HOSPITAL_COMMUNITY)
Admission: RE | Disposition: A | Discharge status: Home / Self Care | Payer: Self-pay | Source: Home / Self Care | Attending: Gastroenterology

## 2019-02-14 DIAGNOSIS — K2100 Gastro-esophageal reflux disease with esophagitis, without bleeding: Secondary | ICD-10-CM | POA: Diagnosis present

## 2019-02-14 DIAGNOSIS — K269 Duodenal ulcer, unspecified as acute or chronic, without hemorrhage or perforation: Secondary | ICD-10-CM

## 2019-02-14 DIAGNOSIS — J301 Allergic rhinitis due to pollen: Secondary | ICD-10-CM | POA: Diagnosis not present

## 2019-02-14 DIAGNOSIS — Z79899 Other long term (current) drug therapy: Secondary | ICD-10-CM | POA: Diagnosis not present

## 2019-02-14 DIAGNOSIS — Z9889 Other specified postprocedural states: Secondary | ICD-10-CM

## 2019-02-14 HISTORY — PX: ESOPHAGOGASTRODUODENOSCOPY (EGD) WITH PROPOFOL: SHX5813

## 2019-02-14 HISTORY — PX: TRANSORAL INCISIONLESS FUNDOPLICATION: SHX6840

## 2019-02-14 SURGERY — ESOPHAGOGASTRODUODENOSCOPY (EGD) WITH PROPOFOL
Anesthesia: General

## 2019-02-14 MED ORDER — LIDOCAINE HCL (CARDIAC) PF 100 MG/5ML IV SOSY
PREFILLED_SYRINGE | INTRAVENOUS | Status: DC | PRN
  Administered 2019-02-14: 60 mg via INTRAVENOUS

## 2019-02-14 MED ORDER — SIMETHICONE 80 MG PO CHEW
80.0000 mg | CHEWABLE_TABLET | Freq: Four times a day (QID) | ORAL | Status: DC | PRN
  Administered 2019-02-14: 20:00:00 80 mg via ORAL
  Filled 2019-02-14: qty 1

## 2019-02-14 MED ORDER — LACTATED RINGERS IV SOLN
INTRAVENOUS | Status: DC
  Administered 2019-02-14: 11:00:00 1000 mL via INTRAVENOUS

## 2019-02-14 MED ORDER — METOCLOPRAMIDE HCL 5 MG/ML IJ SOLN
10.0000 mg | Freq: Four times a day (QID) | INTRAMUSCULAR | Status: DC
  Administered 2019-02-14 – 2019-02-15 (×3): 10 mg via INTRAVENOUS
  Filled 2019-02-14 (×3): qty 2

## 2019-02-14 MED ORDER — ACETAMINOPHEN-CODEINE 120-12 MG/5ML PO SOLN: 15.0000 mL | ORAL | Status: DC | PRN

## 2019-02-14 MED ORDER — PANTOPRAZOLE SODIUM 40 MG PO TBEC
40.0000 mg | DELAYED_RELEASE_TABLET | Freq: Two times a day (BID) | ORAL | Status: DC
  Administered 2019-02-14 – 2019-02-15 (×2): 40 mg via ORAL
  Filled 2019-02-14 (×2): qty 1

## 2019-02-14 MED ORDER — ONDANSETRON HCL 4 MG/2ML IJ SOLN
INTRAMUSCULAR | Status: AC
  Filled 2019-02-14: qty 2

## 2019-02-14 MED ORDER — ROCURONIUM BROMIDE 100 MG/10ML IV SOLN
INTRAVENOUS | Status: DC | PRN
  Administered 2019-02-14: 10 mg via INTRAVENOUS
  Administered 2019-02-14: 60 mg via INTRAVENOUS

## 2019-02-14 MED ORDER — FAMOTIDINE IN NACL 20-0.9 MG/50ML-% IV SOLN
20.0000 mg | Freq: Once | INTRAVENOUS | Status: AC
  Administered 2019-02-14: 20 mg via INTRAVENOUS
  Filled 2019-02-14: qty 50

## 2019-02-14 MED ORDER — ONDANSETRON HCL 4 MG/2ML IJ SOLN
4.0000 mg | Freq: Once | INTRAMUSCULAR | Status: AC
  Administered 2019-02-14: 4 mg via INTRAVENOUS

## 2019-02-14 MED ORDER — SCOPOLAMINE 1 MG/3DAYS TD PT72
MEDICATED_PATCH | TRANSDERMAL | Status: AC
  Filled 2019-02-14: qty 1

## 2019-02-14 MED ORDER — DEXAMETHASONE SODIUM PHOSPHATE 10 MG/ML IJ SOLN
8.0000 mg | Freq: Four times a day (QID) | INTRAMUSCULAR | Status: DC
  Administered 2019-02-14 – 2019-02-15 (×3): 8 mg via INTRAVENOUS
  Filled 2019-02-14 (×3): qty 1

## 2019-02-14 MED ORDER — SODIUM CHLORIDE 0.9 % IV SOLN
INTRAVENOUS | Status: DC
  Administered 2019-02-14: 18:00:00 via INTRAVENOUS

## 2019-02-14 MED ORDER — FENTANYL CITRATE (PF) 100 MCG/2ML IJ SOLN
INTRAMUSCULAR | Status: AC
  Filled 2019-02-14: qty 2

## 2019-02-14 MED ORDER — PROPOFOL 10 MG/ML IV BOLUS
INTRAVENOUS | Status: AC
  Filled 2019-02-14: qty 20

## 2019-02-14 MED ORDER — LACTATED RINGERS IV SOLN: INTRAVENOUS | Status: DC

## 2019-02-14 MED ORDER — FENTANYL CITRATE (PF) 100 MCG/2ML IJ SOLN
INTRAMUSCULAR | Status: DC | PRN
  Administered 2019-02-14: 25 ug via INTRAVENOUS
  Administered 2019-02-14: 100 ug via INTRAVENOUS
  Administered 2019-02-14: 25 ug via INTRAVENOUS
  Administered 2019-02-14: 50 ug via INTRAVENOUS

## 2019-02-14 MED ORDER — MIDAZOLAM HCL 5 MG/5ML IJ SOLN
INTRAMUSCULAR | Status: DC | PRN
  Administered 2019-02-14: 2 mg via INTRAVENOUS

## 2019-02-14 MED ORDER — MIDAZOLAM HCL 2 MG/2ML IJ SOLN
INTRAMUSCULAR | Status: AC
  Filled 2019-02-14: qty 2

## 2019-02-14 MED ORDER — SUGAMMADEX SODIUM 200 MG/2ML IV SOLN
INTRAVENOUS | Status: DC | PRN
  Administered 2019-02-14: 200 mg via INTRAVENOUS

## 2019-02-14 MED ORDER — CEFAZOLIN SODIUM-DEXTROSE 2-4 GM/100ML-% IV SOLN
2.0000 g | Freq: Once | INTRAVENOUS | Status: AC
  Administered 2019-02-14: 2 g via INTRAVENOUS
  Filled 2019-02-14: qty 100

## 2019-02-14 MED ORDER — SUCCINYLCHOLINE CHLORIDE 20 MG/ML IJ SOLN
INTRAMUSCULAR | Status: DC | PRN
  Administered 2019-02-14: 140 mg via INTRAVENOUS

## 2019-02-14 MED ORDER — PROPOFOL 10 MG/ML IV BOLUS
INTRAVENOUS | Status: AC
  Filled 2019-02-14: qty 40

## 2019-02-14 MED ORDER — LIDOCAINE VISCOUS HCL 2 % MT SOLN: 5.0000 mL | Freq: Three times a day (TID) | OROMUCOSAL | Status: DC | PRN

## 2019-02-14 MED ORDER — ONDANSETRON HCL 4 MG/2ML IJ SOLN: 4.0000 mg | Freq: Once | INTRAMUSCULAR | Status: DC | PRN

## 2019-02-14 MED ORDER — ONDANSETRON HCL 4 MG/2ML IJ SOLN
INTRAMUSCULAR | Status: DC | PRN
  Administered 2019-02-14: 4 mg via INTRAVENOUS

## 2019-02-14 MED ORDER — DEXAMETHASONE SODIUM PHOSPHATE 10 MG/ML IJ SOLN
INTRAMUSCULAR | Status: DC | PRN
  Administered 2019-02-14: 10 mg via INTRAVENOUS

## 2019-02-14 MED ORDER — PROPOFOL 500 MG/50ML IV EMUL
INTRAVENOUS | Status: DC | PRN
  Administered 2019-02-14: 150 ug/kg/min via INTRAVENOUS

## 2019-02-14 MED ORDER — ONDANSETRON HCL 4 MG/2ML IJ SOLN
4.0000 mg | Freq: Four times a day (QID) | INTRAMUSCULAR | Status: DC
  Administered 2019-02-14 – 2019-02-15 (×3): 4 mg via INTRAVENOUS
  Filled 2019-02-14 (×3): qty 2

## 2019-02-14 MED ORDER — PROPOFOL 10 MG/ML IV BOLUS
INTRAVENOUS | Status: DC | PRN
  Administered 2019-02-14: 160 mg via INTRAVENOUS

## 2019-02-14 MED ORDER — SODIUM CHLORIDE 0.9 % IV SOLN
INTRAVENOUS | Status: DC
  Administered 2019-02-14: 11:00:00 500 mL via INTRAVENOUS

## 2019-02-14 MED ORDER — FENTANYL CITRATE (PF) 100 MCG/2ML IJ SOLN
25.0000 ug | INTRAMUSCULAR | Status: DC | PRN
  Administered 2019-02-14 (×4): 25 ug via INTRAVENOUS

## 2019-02-14 MED ORDER — SCOPOLAMINE 1 MG/3DAYS TD PT72
1.0000 | MEDICATED_PATCH | Freq: Once | TRANSDERMAL | Status: DC
  Administered 2019-02-14: 11:00:00 1.5 mg via TRANSDERMAL

## 2019-02-14 SURGICAL SUPPLY — 1 items: KIT ESOPHYX Z+ (Miscellaneous) ×3 IMPLANT

## 2019-02-14 NOTE — Progress Notes (Signed)
Patient placed on hold. Awaiting bed placement.

## 2019-02-14 NOTE — Anesthesia Procedure Notes (Signed)
Procedure Name: Intubation Date/Time: 02/14/2019 11:00 AM Performed by: Glory Buff, CRNA Pre-anesthesia Checklist: Patient identified, Emergency Drugs available, Suction available and Patient being monitored Patient Re-evaluated:Patient Re-evaluated prior to induction Oxygen Delivery Method: Circle system utilized Preoxygenation: Pre-oxygenation with 100% oxygen Induction Type: IV induction Ventilation: Mask ventilation without difficulty Laryngoscope Size: Miller and 3 Grade View: Grade I Tube type: Oral Tube size: 7.5 mm Number of attempts: 1 Airway Equipment and Method: Stylet and Oral airway Placement Confirmation: ETT inserted through vocal cords under direct vision,  positive ETCO2 and breath sounds checked- equal and bilateral Secured at: 21 cm Tube secured with: Tape Dental Injury: Teeth and Oropharynx as per pre-operative assessment

## 2019-02-14 NOTE — H&P (Signed)
Chief Complaint: GERD with Erosive Esophagitis  HPI:    Marc Allen is a 58 y.o. male with a longstanding history of GERD complicated by Erosive Esophagitis, presenting today for Transoral Incisionless Fundoplication (TIF) as with a goal to stop reflux symptoms and as a means to stop or significantly reduce acid suppression therapy.  GI Hx: He has a longstanding history of reflux since at least the 1990's, most recently complicated by dental erosions.  Index sxs of HB and regurgitation, worse at night and early morning along with waterbrash in the morning. No dysphagia or odynophagia.   He currently takes famotidine BID which controls his reflux symptoms generally well, with occasional breakthrough symptoms, particularly at night.  He was previously treated with both lansoprazole and pantoprazole but stopped taking due to concerns for decreased bone density among other ADRs.    Drives trucks for a living, working long hours, so difficult to avoid eating outside 3 hours from bedtime.  Otherwise, tries to avoid exacerbating foods.  GERD-HRQL Questionnaire: 30/50  Endoscopic history: -EGD (09/2013, Dr. Carlean Purl, for Barrett's screening): Normal  -Colonoscopy (09/2013, Dr. Carlean Purl): Mild diverticulosis, otherwise normal.  Recommended repeat in 10 years -EGD (07/2018, Dr. Bryan Lemma): LA Grade A erosive esophagitis, Hill Grade 2 valve, mild duodenitis   Past Medical History:  Diagnosis Date  . Elevated LFTs 03/31/2017  . Flat feet   . GERD (gastroesophageal reflux disease)    not currenlty on any meds  . Hyperlipidemia, mild 02/13/2016  . Plantar fasciitis   . Preventative health care 05/03/2011  . Seasonal allergies   . Tachycardia 09/23/2016  . TMJ syndrome      Past Surgical History:  Procedure Laterality Date  . COLONOSCOPY    . ESOPHAGOGASTRODUODENOSCOPY     Family History  Problem Relation Age of Onset  . Alcohol abuse Paternal Grandfather   . Stroke Paternal  Grandfather   . Hypertension Father   . Prostatitis Father   . Atrial fibrillation Father   . Arthritis Father        DDD  . Autism Son   . Dementia Maternal Grandmother   . Other Mother        Died in car accident  . Alcohol abuse Paternal Uncle   . Arthritis/Rheumatoid Paternal Uncle   . Diabetes Paternal Uncle   . Lung cancer Maternal Aunt        smoker  . Seizures Son        age 74  . Cancer Maternal Uncle        bone cancer, smoker  . Colon cancer Neg Hx   . Prostate cancer Neg Hx   . Rectal cancer Neg Hx   . Stomach cancer Neg Hx   . Esophageal cancer Neg Hx    Social History   Tobacco Use  . Smoking status: Never Smoker  . Smokeless tobacco: Never Used  Substance Use Topics  . Alcohol use: No  . Drug use: No   Current Facility-Administered Medications  Medication Dose Route Frequency Provider Last Rate Last Dose  . 0.9 %  sodium chloride infusion   Intravenous Continuous Panagiotis Oelkers V, DO 20 mL/hr at 02/14/19 1043 500 mL at 02/14/19 1043  . ceFAZolin (ANCEF) IVPB 2g/100 mL premix  2 g Intravenous Once Ermal Haberer V, DO      . famotidine (PEPCID) IVPB 20 mg premix  20 mg Intravenous Once Tajuana Kniskern V, DO 100 mL/hr at  02/14/19 1044 20 mg at 02/14/19 1044  . lactated ringers infusion   Intravenous Continuous Jerrelle Michelsen V, DO      . lactated ringers infusion   Intravenous Continuous Patti Shorb V, DO      . scopolamine (TRANSDERM-SCOP) 1 MG/3DAYS 1.5 mg  1 patch Transdermal Once Shanikka Wonders V, DO   1.5 mg at 02/14/19 1031   Allergies  Allergen Reactions  . Other     Hayfever     Review of Systems: All systems reviewed and negative except where noted in HPI.     Physical Exam:    Wt Readings from Last 3 Encounters:  02/14/19 83.9 kg  08/23/18 82.6 kg  07/22/18 83.9 kg    BP (!) 126/93   Pulse (!) 52   Resp 16   Ht 6' (1.829 m)   Wt 83.9 kg   SpO2 100%   BMI 25.09 kg/m  Constitutional:  Pleasant, in no acute  distress. Psychiatric: Normal mood and affect. Behavior is normal. EENT: Pupils normal.  Conjunctivae are normal. No scleral icterus. Neck supple. No cervical LAD. Cardiovascular: Normal rate, regular rhythm. No edema Pulmonary/chest: Effort normal and breath sounds normal. No wheezing, rales or rhonchi. Abdominal: Soft, nondistended, nontender. Bowel sounds active throughout. There are no masses palpable. No hepatomegaly. Neurological: Alert and oriented to person place and time. Skin: Skin is warm and dry. No rashes noted.   ASSESSMENT AND PLAN;   1) GERD with Erosive Esophagitis: - Plan for TIF today - Again reviewed postoperative dietary modifications and activity/exercise limitations - Plan for overnight admission for 23-hour observation after the procedure -Additional recommendations pending procedure  The indications, risks, and benefits of EGD with TIF were explained to the patient in detail. Risks include but are not limited to bleeding, perforation, adverse reaction to medications, and cardiopulmonary compromise. Sequelae include but are not limited to the possibility of surgery, hositalization, and mortality. The patient verbalized understanding and wished to proceed. All questions answered.    Daritza Brees V Kalicia Dufresne, DO, FACG  02/14/2019, 10:44 AM   No ref. provider found

## 2019-02-14 NOTE — Op Note (Addendum)
[facility] Patient Name: Marc Allen Procedure Date: 02/14/2019 MRN: [id number] Attending MD: Gerrit Heck , MD Date of Birth: [birth year] CSN: [id number] Age: 58 Admit Type: Outpatient Procedure:                Upper GI endoscopy Indications:              58 yo male with a long-standing history of GERD                            with erosive esophagitis, incompletely controlled                            with acid suppression therapy, presents today for                            Transoral Incisionless Fundoplication (TIF). Providers:                Gerrit Heck, MD, Baird Cancer, RN, Jobe Igo, RN, Marguerita Merles, Technician Referring MD:              Medicines:                General Anesthesia Complications:            No immediate complications. Estimated Blood Loss:     Estimated blood loss was minimal. Procedure:                Pre-Anesthesia Assessment:                           - Prior to the procedure, a History and Physical                            was performed, and patient medications and                            allergies were reviewed. The patient's tolerance of                            previous anesthesia was also reviewed. The risks                            and benefits of the procedure and the sedation                            options and risks were discussed with the patient.                            All questions were answered, and informed consent                            was obtained. Prior Anticoagulants: The patient has  taken no previous anticoagulant or antiplatelet                            agents. ASA Grade Assessment: II - A patient with                            mild systemic disease. After reviewing the risks                            and benefits, the patient was deemed in                            satisfactory condition to undergo the procedure.                    After obtaining informed consent, the endoscope was                            passed under direct vision. Throughout the                            procedure, the patient's blood pressure, pulse, and                            oxygen saturations were monitored continuously. The                            GIF-H190 ([id number]) Olympus gastroscope was                            introduced through the mouth, and advanced to the                            second part of duodenum. The upper GI endoscopy was                            accomplished without difficulty. The patient                            tolerated the procedure well. Scope In: Scope Out: Findings:      LA Grade A (one or more mucosal breaks less than 5 mm, not extending       between tops of 2 mucosal folds) esophagitis with no bleeding was found       44 cm from the incisors. The decision was made to perform transoral       fundoplication with the EsophyX Z+ system. Before the procedure, the       gastroesophageal flap valve was classified as Hill Grade II (fold       present, opens with respiration). The endoscope was withdrawn, placed       through the plication device, reinserted into the patient and advanced       past the level of the GE junction at 44 cm from the incisors and into       the stomach. Next, the endoscope was advanced beyond the device and  retroflexed. The first plication site was identified at the 11 o'clock       position. With the device in the proper position, the helical retractor       was deployed and tissue was pulled into the mold before it was closed.       The device was rotated, suction was applied using the invaginator, then       the device was advanced slightly and two H-shaped fasteners were placed.       The device was reloaded and the process repeated in order to deploy a       total of six fasteners at the first site. The device was then rotated to       the 1  o'clock position after which the helical retractor was used to       grasp additional tissue within the mold before rotation and deployment       of a total of six fasteners at the second site. To complete       reconstruction of the valve, additional fasteners were deployed at the       following sites: four fasteners at 5 o'clock and four fasteners at 7       o'clock positions. In total, 20 fasteners contributed to create a valve       measuring 3 cm in length which involved 270 degrees of the circumference       upon retroflexed view. The EsophyX device and endoscope were then       removed. Relook endoscopy was performed prior to the conclusion of the       case to confirm the above findings. Estimated blood loss was minimal.      The gastric fundus, gastric body, incisura, gastric antrum and pylorus       were normal.      The duodenal bulb, first portion of the duodenum and second portion of       the duodenum were normal. Impression:               - LA Grade A reflux esophagitis.                           - Normal gastric fundus, gastric body, incisura,                            antrum and pylorus.                           - Normal duodenal bulb, first portion of the                            duodenum and second portion of the duodenum.                           - EsophyX Z+ transoral fundoplication was performed.                           - No specimens collected. Moderate Sedation:      Not Applicable - Patient had care per Anesthesia. Recommendation:           -Admit to surgical ward for overnight observation  with anticipated discharge tomorrow                           -Zofran 4 mg IV every 6 hours x24 hours, then prn                           -Reglan 10 mg every 6 hours x24 hours, then prn                           -Resume scopolamine patch x3 days (applied preop)                           -Protonix 40 mg p.o. BID x2 weeks, then 40 mg daily                             x2 weeks, then 20 mg daily x1 week then prn                           -Decadron 8 mg every 6 hours times max 5 doses                           -Gas-X (simethicone) 4225 mg p.o. prn every 6 hours                            gas pain, abdominal discomfort                           -Tylenol 3 (APAP 120 mg/codeine 12 mg per 5 mL): 15                            mL's every 4 hours prn pain                           -Colace 100 mg p.o. twice daily if taking pain                            medications                           -Clear liquid diet okay overnight                           -Okay to ambulate with assist around the ward                           -Please do not hesitate to contact me directly with                            any postoperative questions or concerns Procedure Code(s):        --- Professional ---                           97588, Esophagogastroduodenoscopy, flexible,  transoral; with esophagogastric fundoplasty,                            partial or complete, includes duodenoscopy when                            performed Diagnosis Code(s):        --- Professional ---                           K21.0, Gastro-esophageal reflux disease with                            esophagitis CPT copyright 2019 American Medical Association. All rights reserved. The codes documented in this report are preliminary and upon coder review may  be revised to meet current compliance requirements. Doristine LocksVito Cirigliano, MD 02/14/2019 12:45:14 PM Number of Addenda: 0

## 2019-02-14 NOTE — Interval H&P Note (Signed)
History and Physical Interval Note:  02/14/2019 10:54 AM  Marc Allen  has presented today for surgery, with the diagnosis of duodenal ulcer/GERD with esophagitis.  The various methods of treatment have been discussed with the patient and family. After consideration of risks, benefits and other options for treatment, the patient has consented to  Procedure(s): TRANSORAL INCISIONLESS FUNDOPLICATION (N/A) as a surgical intervention.  The patient's history has been reviewed, patient examined, no change in status, stable for surgery.  I have reviewed the patient's chart and labs.  Questions were answered to the patient's satisfaction.     Dominic Pea [name]

## 2019-02-14 NOTE — Anesthesia Preprocedure Evaluation (Addendum)
Anesthesia Evaluation  Patient identified by MRN, date of birth, ID band Patient awake    Reviewed: Allergy & Precautions, NPO status , Patient's Chart, lab work & pertinent test results  Airway Mallampati: II  TM Distance: >3 FB Neck ROM: Full    Dental  (+) Teeth Intact, Dental Advisory Given   Pulmonary    breath sounds clear to auscultation       Cardiovascular  Rhythm:Regular Rate:Normal     Neuro/Psych    GI/Hepatic   Endo/Other    Renal/GU      Musculoskeletal   Abdominal   Peds  Hematology   Anesthesia Other Findings   Reproductive/Obstetrics                             Anesthesia Physical Anesthesia Plan  ASA: II  Anesthesia Plan: General   Post-op Pain Management:    Induction: Intravenous  PONV Risk Score and Plan: Ondansetron, Dexamethasone, Propofol infusion and Scopolamine patch - Pre-op  Airway Management Planned: Oral ETT  Additional Equipment:   Intra-op Plan:   Post-operative Plan: Extubation in OR  Informed Consent: I have reviewed the patients History and Physical, chart, labs and discussed the procedure including the risks, benefits and alternatives for the proposed anesthesia with the patient or authorized representative who has indicated his/her understanding and acceptance.     Dental advisory given  Plan Discussed with: CRNA and Anesthesiologist  Anesthesia Plan Comments:         Anesthesia Quick Evaluation

## 2019-02-14 NOTE — Transfer of Care (Signed)
Immediate Anesthesia Transfer of Care Note  Patient: Marc Allen  Procedure(s) Performed: TRANSORAL INCISIONLESS FUNDOPLICATION (N/A )  Patient Location: PACU  Anesthesia Type:General  Level of Consciousness: drowsy, patient cooperative and responds to stimulation  Airway & Oxygen Therapy: Patient Spontanous Breathing and Patient connected to face mask oxygen  Post-op Assessment: Report given to RN and Post -op Vital signs reviewed and stable  Post vital signs: Reviewed and stable  Last Vitals:  Vitals Value Taken Time  BP    Temp    Pulse    Resp    SpO2      Last Pain:  Vitals:   02/14/19 1055  TempSrc:   PainSc: 0-No pain         Complications: No apparent anesthesia complications

## 2019-02-15 DIAGNOSIS — Z9889 Other specified postprocedural states: Secondary | ICD-10-CM | POA: Diagnosis not present

## 2019-02-15 DIAGNOSIS — K210 Gastro-esophageal reflux disease with esophagitis: Secondary | ICD-10-CM | POA: Diagnosis not present

## 2019-02-15 MED ORDER — METOCLOPRAMIDE HCL 10 MG PO TABS: 10.0000 mg | ORAL_TABLET | Freq: Four times a day (QID) | ORAL | 1 refills | Status: DC | PRN

## 2019-02-15 MED ORDER — SIMETHICONE 80 MG PO CHEW: 80.0000 mg | CHEWABLE_TABLET | Freq: Four times a day (QID) | ORAL | 0 refills | Status: DC | PRN

## 2019-02-15 MED ORDER — ONDANSETRON HCL 4 MG PO TABS: 4.0000 mg | ORAL_TABLET | Freq: Every day | ORAL | 1 refills | Status: DC | PRN

## 2019-02-15 MED ORDER — FAMOTIDINE 40 MG PO TABS: 20.0000 mg | ORAL_TABLET | Freq: Two times a day (BID) | ORAL | 1 refills | Status: DC

## 2019-02-15 NOTE — Plan of Care (Signed)
All goals met for d/c 

## 2019-02-15 NOTE — Discharge Instructions (Signed)
Diet:  °- 2 weeks of liquid soft foods followed by 4 weeks slowly progressive diet back to regular  °- Provided with handout for post operative diet plan  °  °Post Op Activity:  °- Week 1: encourage short distance walking, minimal physical activity, no lifting >5 lbs  °- Week 2: Slow climbing stairs, no intense exercise, no lifting >5 lbs  °- Week 3-6: No intense exercise, may lift up to 25 lbs  °- Week 7: Resume normal activity  °  °

## 2019-02-15 NOTE — Progress Notes (Signed)
Pt alert and oriented, d/c instructions given. Pt d/cd home with spouse.

## 2019-02-15 NOTE — Progress Notes (Signed)
[facility] ROUNDING NOTE   Subjective: No acute events overnight. Tolerating liquids without issue. Eager to d/c to home today.    Objective: Vital signs in last 24 hours: Temp:  [97.5 F (36.4 C)-98.8 F (37.1 C)] 97.9 F (36.6 C) (09/02 0554) Pulse Rate:  [45-66] 53 (09/02 0554) Resp:  [16-23] 20 (09/02 0554) BP: (94-126)/(61-93) 94/61 (09/02 0554) SpO2:  [96 %-100 %] 98 % (09/02 0554) Weight:  [83.9 kg] 83.9 kg (09/01 1015) Last BM Date: 02/13/19 General: NAD Abdomen: Soft, NT, ND Ext: No c/c/e    Intake/Output from previous day: 09/01 0701 - 09/02 0700 In: 1672.1 [P.O.:240; I.V.:1332.1; IV Piggyback:100] Out: 610 [Urine:600; Blood:10] Intake/Output this shift: No intake/output data recorded.   Lab Results: No results for input(s): WBC, HGB, PLT, MCV in the last 72 hours. BMET No results for input(s): NA, K, CL, CO2, GLUCOSE, BUN, CREATININE, CALCIUM in the last 72 hours. LFT No results for input(s): PROT, ALBUMIN, AST, ALT, ALKPHOS, BILITOT, BILIDIR, IBILI in the last 72 hours. PT/INR No results for input(s): INR in the last 72 hours.    Imaging/Other results: No results found.   Assessment and Plan:  Marc Allen is a 58 y.o. male s/p EGD with Transoral Incisionless Fundoplication (TIF) completed yesterday with no events on overnight observation. Will plan on d/c to home today with the following plan:   - Pepcid 20 mg PO BID for 2 weeks, then 20 mg daily for 2 weeks, then prn  - D/c with Zofran 4 mg PO prn Q6 hours for nausea  - D/c with Reglan 10 mg PO prn Q6 hours for nausea  - D/c with Simethicone 80 mg PO prn Q6 hours for bloating/abdominal discomfort - Ok to resume home medications   Diet:  - 2 weeks of liquid soft foods followed by 4 weeks slowly progressive diet back to regular  - Provided with handout for post operative diet plan   Post Op Activity:  - Week 1: encourage short distance walking, minimal physical activity, no  lifting >5 lbs  - Week 2: Slow climbing stairs, no intense exercise, no lifting >5 lbs  - Week 3-6: No intense exercise, may lift up to 25 lbs  - Week 7: Resume normal activity   - Will call the patient to schedule a follow-up with me in the [facility] [place], DO  02/15/2019, 7:43 AM [facility] Pager [phone]

## 2019-02-15 NOTE — Discharge Summary (Signed)
    [facility] Discharge Summary  Name: Marc Allen MRN: [id number] DOB: 07/05/1960 58 y.o. PCP:  Mosie Lukes, MD  Date of Admission: 02/14/2019  9:14 AM Date of Discharge: 02/15/2019 Attending Physician: Lavena Bullion, DO  Discharge Diagnosis: Active Problems:   Gastroesophageal reflux disease with esophagitis   GERD with esophagitis   History of fundoplication  Consultations:  None  GI Procedures:  TIF  History/Physical Exam:  See Admission H&P  Admission HPI:  Patient underwent TIF procedure with Dr. Tamsen Roers on 9/1.  Procedure went well.  Patient was admitted overnight for observation.  No events, did well.  Was tolerating diet and feeling well on the day of discharge.  Discharge Vitals:  BP 94/61 (BP Location: Left Arm)   Pulse (!) 53   Temp 97.9 F (36.6 C) (Oral)   Resp 20   Ht 6' (1.829 m)   Wt 83.9 kg   SpO2 98%   BMI 25.09 kg/m   Discharge Labs: No results found for this or any previous visit (from the past 24 hour(s)).  Disposition and follow-up:   Mr.Marc Allen was discharged from [facility] in stable condition.    Follow-up Appointments: Discharge Instructions    Diet - low sodium heart healthy   Complete by: As directed    Diet:  - 2 weeks of liquid soft foods followed by 4 weeks slowly progressive diet back to regular  - Provided with handout for post operative diet plan   Discharge instructions   Complete by: As directed    Increase activity slowly   Complete by: As directed    Post Op Activity:  - Week 1: encourage short distance walking, minimal physical activity, no lifting >5 lbs  - Week 2: Slow climbing stairs, no intense exercise, no lifting >5 lbs  - Week 3-6: No intense exercise, may lift up to 25 lbs  - Week 7: Resume normal activity      Discharge Medications: Allergies as of 02/15/2019      Reactions   Other    Hayfever      Medication List    TAKE these medications   famotidine 40 MG  tablet Commonly known as: PEPCID Take 0.5 tablets (20 mg total) by mouth 2 (two) times daily. Should take 20 mg BID for 2 weeks then 20 mg daily for 2 weeks then as needed. What changed: See the new instructions.   fluticasone 50 MCG/ACT nasal spray Commonly known as: FLONASE SPRAY 2 SPRAYS INTO EACH NOSTRIL EVERY DAY   metoCLOPramide 10 MG tablet Commonly known as: REGLAN Take 1 tablet (10 mg total) by mouth every 6 (six) hours as needed for nausea.   ondansetron 4 MG tablet Commonly known as: Zofran Take 1 tablet (4 mg total) by mouth daily as needed for nausea or vomiting.   simethicone 80 MG chewable tablet Commonly known as: MYLICON Chew 1 tablet (80 mg total) by mouth every 6 (six) hours as needed for flatulence (gas, bloating, abdominal discomfort).       Signed: Laban Emperor. Daana Petrasek 02/15/2019, 10:14 AM

## 2019-02-16 ENCOUNTER — Encounter (HOSPITAL_COMMUNITY): Payer: Self-pay | Admitting: Gastroenterology

## 2019-02-16 NOTE — Anesthesia Postprocedure Evaluation (Signed)
Anesthesia Post Note  Patient: JASHAWN FLOYD  Procedure(s) Performed: TRANSORAL INCISIONLESS FUNDOPLICATION (N/A ) ESOPHAGOGASTRODUODENOSCOPY (EGD) WITH PROPOFOL (N/A )     Patient location during evaluation: PACU Anesthesia Type: General Level of consciousness: awake and alert Pain management: pain level controlled Vital Signs Assessment: post-procedure vital signs reviewed and stable Respiratory status: spontaneous breathing, nonlabored ventilation, respiratory function stable and patient connected to nasal cannula oxygen Cardiovascular status: blood pressure returned to baseline and stable Postop Assessment: no apparent nausea or vomiting Anesthetic complications: no    Last Vitals:  Vitals:   02/14/19 2154 02/15/19 0554  BP: 115/72 94/61  Pulse: (!) 52 (!) 53  Resp: 20 20  Temp: 37.1 C 36.6 C  SpO2: 100% 98%    Last Pain:  Vitals:   02/15/19 0843  TempSrc:   PainSc: 0-No pain                 Doralene Glanz,Donnavan COKER

## 2019-02-17 ENCOUNTER — Telehealth: Payer: Self-pay | Admitting: Gastroenterology

## 2019-02-17 NOTE — Telephone Encounter (Signed)
Called and spoke with patient-patient informed of MD recommendations; patient is agreeable with plan of care; RX has already been sent to the pharmacy for the patient on 02/15/2019-pt did not pick up this RX; patient will also try the Simethicone as needed for gas (belching); Patient verbalized understanding of information/instructions;  Patient was advised to call the office at [phone] if questions/concerns arise;

## 2019-02-17 NOTE — Telephone Encounter (Signed)
Okay to refill Pepcid 20 mg.  Take 1 tablet twice daily for the next 2 weeks, then 1 tablet/day for 2 weeks, then as needed. #60.  RF 1  Can use simethicone to see if that helps with the belching.

## 2019-02-17 NOTE — Telephone Encounter (Signed)
Needs refill on Pepcid   Needing to know if the "feeling of having to burp is normal and is there anything I can do to help with that feeling to go away"  Work note with restrictions and then full duty sent to patient via [employer] [place] and via mail  Please advise

## 2019-02-17 NOTE — Telephone Encounter (Signed)
Pls call pt, he would like to speak with you regarding the activities that he can and can't do. He did not disclose much information.

## 2019-02-24 ENCOUNTER — Telehealth: Payer: Self-pay | Admitting: Gastroenterology

## 2019-02-24 DIAGNOSIS — K2100 Gastro-esophageal reflux disease with esophagitis, without bleeding: Secondary | ICD-10-CM

## 2019-02-27 NOTE — Telephone Encounter (Signed)
Left message for the patient to call back to the office= Patient can return to work on 03/07/2019 with no restrictions;

## 2019-03-02 NOTE — Telephone Encounter (Signed)
Called and spoke with patient-patient reports he is still having a little bit of acid reflux at night when he lays down, he is propping up to sleep, is taking Pepcid at night, wants to know if there is anything else he can do to minmize the "feeling of something being stuck in my throat when I lay down and the acid reflux gets worse when I lay down to sleep at night";   please advise

## 2019-03-03 MED ORDER — PANTOPRAZOLE SODIUM 40 MG PO TBEC: DELAYED_RELEASE_TABLET | ORAL | 0 refills | Status: DC

## 2019-03-03 NOTE — Telephone Encounter (Signed)
This can sometimes occur as the swelling slowly improves in the esophagus. Let's plan for short course of Protonix 40 mg pre-dinner along with the Pepcid 20 mg qhs. Protonix 20 mg tabs. Take 2 tabs PO, 30-60 minutes before dinner meal x2 weeks, then reduce to 20 mg (1 tab) pre-dinner x2 weeks, then ok to wean off.   Ok to return to work on 9/22, but is still on a <25 lb restriction for 3 weeks post op.

## 2019-03-03 NOTE — Telephone Encounter (Signed)
Called and spoke with patient-patient informed of MD recommendations; patient is agreeable with plan of care and verified PCP; result note routed to PCP per MD recommendations; Patient verbalized understanding of information/instructions;  Patient was advised to call the office at [phone] if questions/concerns arise; RX sent to pharmacy of patient choice;

## 2019-03-06 ENCOUNTER — Ambulatory Visit: Payer: BC Managed Care – PPO | Admitting: Gastroenterology

## 2019-03-23 ENCOUNTER — Other Ambulatory Visit: Payer: Self-pay | Admitting: Gastroenterology

## 2019-03-23 DIAGNOSIS — K2100 Gastro-esophageal reflux disease with esophagitis, without bleeding: Secondary | ICD-10-CM

## 2019-04-11 ENCOUNTER — Other Ambulatory Visit: Payer: Self-pay | Admitting: Gastroenterology

## 2019-04-11 ENCOUNTER — Encounter: Payer: Self-pay | Admitting: Gastroenterology

## 2019-04-11 ENCOUNTER — Other Ambulatory Visit: Payer: Self-pay

## 2019-04-11 ENCOUNTER — Ambulatory Visit: Payer: BC Managed Care – PPO | Admitting: Gastroenterology

## 2019-04-11 VITALS — BP 96/70 | HR 64 | Temp 98.2°F | Ht 72.0 in | Wt 176.0 lb

## 2019-04-11 DIAGNOSIS — K2100 Gastro-esophageal reflux disease with esophagitis, without bleeding: Secondary | ICD-10-CM

## 2019-04-11 DIAGNOSIS — Z9889 Other specified postprocedural states: Secondary | ICD-10-CM

## 2019-04-11 MED ORDER — FAMOTIDINE 20 MG PO TABS: 20.0000 mg | ORAL_TABLET | Freq: Every day | ORAL | 3 refills | Status: DC | PRN

## 2019-04-11 MED ORDER — PANTOPRAZOLE SODIUM 40 MG PO TBEC: 40.0000 mg | DELAYED_RELEASE_TABLET | Freq: Every day | ORAL | 3 refills | Status: DC

## 2019-04-11 NOTE — Patient Instructions (Addendum)
If you are age 56 or older, your body mass index should be between 23-30. Your Body mass index is 23.87 kg/m. If this is out of the aforementioned range listed, please consider follow up with your Primary Care Provider.  If you are age 29 or younger, your body mass index should be between 19-25. Your Body mass index is 23.87 kg/m. If this is out of the aformentioned range listed, please consider follow up with your Primary Care Provider.   To help prevent the possible spread of infection to our patients, communities, and staff; we will be implementing the following measures:  As of now we are not allowing any visitors/family members to accompany you to any upcoming appointments with [facility]. If you have any concerns about this please contact our office to discuss prior to the appointment.   We have sent the following medications to your pharmacy for you to pick up at your convenience: Protonix 40mg  by mouth once daily as needed. Pepcid 20mg  by mouth once daily as needed.  Please call our office at [phone] to set up your 6-12 month follow up visit.   It was a pleasure to see you today!  Vito Cirigliano, D.O.

## 2019-04-11 NOTE — Progress Notes (Signed)
P  Chief Complaint:    Follow-up after TIF  GI History: He has a longstanding history of reflux since at least the 1990's,most recentlycomplicated by dental erosions. Index sxs of HB and regurgitation, worse at night and early morning along with waterbrashin the morning. No dysphagia or odynophagia. Previously treated with famotidine bid with good clinical response, but occasional breakthrough symptoms, particularly at night.  Prior to that, treated with lansoprazole, pantoprazole.  Underwent successful Transoral Incisionless Fundoplication (TIF) on 02/14/2019, and has subsequently weaned off all acid suppression therapy.  Endoscopic history: -EGD (09/2013, Dr. Leone Allen, for Barrett's screening): Normal  -Colonoscopy (09/2013, Dr. Leone Allen): Mild diverticulosis, otherwise normal. Recommended repeat in 10 years -EGD (07/2018, Dr. Barron Allen): LA Grade A erosive esophagitis, Hill Grade 2 valve, mild duodenitis -EGD with TIF (02/14/2019, Dr. Barron Allen): LA Grade A erosive esophagitis, Hill Grade 2 valve treated with successful TIF with placement of 20 fasteners forming a 270 degree valve, 3 cm in length.  HPI:     Patient is a 58 y.o. male presenting to the Gastroenterology Clinic for follow-up.  Did have nocturnal regurgitation and globus sensation 1 to 2 weeks after the procedure, treated with Protonix 40 mg predinner, Pepcid 20 mg qhs, then titrated off all acid suppression therapy.  He has had no breakthrough reflux symptoms.  He denies describes intermittent subxiphoid/lower chest discomfort and occasional gas pressure.  No dysphagia.  Tolerating all p.o. intake.  In total lost approximately 15 pounds with the postoperative diet, and has gained 5 pounds back.    Otherwise, feels well and very pleased with the results of the TIF.    Review of systems:     No chest pain, no SOB, no fevers, no urinary sx   Past Medical History:  Diagnosis Date  . Elevated LFTs 03/31/2017  . Flat feet    . GERD (gastroesophageal reflux disease)    not currenlty on any meds  . Hyperlipidemia, mild 02/13/2016  . Plantar fasciitis   . Preventative health care 05/03/2011  . Seasonal allergies   . Tachycardia 09/23/2016  . TMJ syndrome     Patient's surgical history, family medical history, social history, medications and allergies were all reviewed in Epic    Current Outpatient Medications  Medication Sig Dispense Refill  . famotidine (PEPCID) 40 MG tablet Take 0.5 tablets (20 mg total) by mouth 2 (two) times daily. Should take 20 mg BID for 2 weeks then 20 mg daily for 2 weeks then as needed. (Patient not taking: Reported on 04/11/2019) 30 tablet 1  . fluticasone (FLONASE) 50 MCG/ACT nasal spray SPRAY 2 SPRAYS INTO EACH NOSTRIL EVERY DAY (Patient not taking: Reported on 02/08/2019) 48 g 1  . pantoprazole (PROTONIX) 40 MG tablet Take 2 tabs 30-60 minutes prior to evening meal for 2 weeks then take 1 tab 30-60 minutes prior to evening meal for 2 weeks. (Patient not taking: Reported on 04/11/2019) 42 tablet 0  . simethicone (MYLICON) 80 MG chewable tablet Chew 1 tablet (80 mg total) by mouth every 6 (six) hours as needed for flatulence (gas, bloating, abdominal discomfort). (Patient not taking: Reported on 04/11/2019) 30 tablet 0   No current facility-administered medications for this visit.     Physical Exam:     BP 96/70   Pulse 64   Temp 98.2 F (36.8 C)   Ht 6' (1.829 m)   Wt 176 lb (79.8 kg)   BMI 23.87 kg/m   GENERAL:  Pleasant male in NAD PSYCH: :  Cooperative, normal affect EENT:  conjunctiva pink, mucous membranes moist, neck supple without masses CARDIAC:  RRR, no murmur Cazarez, no peripheral edema PULM: Normal respiratory effort, lungs CTA bilaterally, no wheezing ABDOMEN:  Nondistended, soft, nontender. No obvious masses, no hepatomegaly,  normal bowel sounds SKIN:  turgor, no lesions seen Musculoskeletal:  Normal muscle tone, normal strength NEURO: Alert and oriented x  3, no focal neurologic deficits   IMPRESSION and PLAN:    1) GERD with erosive esophagitis 2) Prior Transoral Incisionless Fundoplication (TIF)  Longstanding history of GERD complicated by erosive esophagitis, with resolution of reflux s/p TIF.  Has since weaned off all acid suppression therapy and tolerating all p.o. intake. -Discussed some of the expected postoperative symptoms that he endorses today.  Expect these to wane with time -Continue to advance diet back to full -He asked for Rx for Protonix and Pepcid to have on hand.  Has not used any acid suppression therapy in over a month.  Okay to refill these now, but asked him to call me if he is experiencing regular breakthrough symptoms -RTC in 6 to 12 months or sooner as needed     I spent a total of 25 minutes of face-to-face time with the patient. Greater than 50% of the time was spent counseling and coordinating care.      Marc Allen ,DO, FACG 04/11/2019, 3:56 PM

## 2020-04-02 ENCOUNTER — Other Ambulatory Visit: Payer: Self-pay

## 2020-04-02 ENCOUNTER — Telehealth (INDEPENDENT_AMBULATORY_CARE_PROVIDER_SITE_OTHER): Payer: BC Managed Care – PPO | Admitting: Medical

## 2020-04-02 DIAGNOSIS — N419 Inflammatory disease of prostate, unspecified: Secondary | ICD-10-CM

## 2020-04-02 DIAGNOSIS — N390 Urinary tract infection, site not specified: Secondary | ICD-10-CM

## 2020-04-02 MED ORDER — CIPROFLOXACIN HCL 500 MG PO TABS: 500.0000 mg | ORAL_TABLET | Freq: Two times a day (BID) | ORAL | 0 refills | Status: DC

## 2020-04-02 NOTE — Patient Instructions (Addendum)
You do appear to have prostatitis.  Will prescribe Cipro antibiotic and recommend that you hydrate well.  We will get ua, urine, urine culture,  CBC and PSA lab work tomorrow. Start cipro   Follow up date to be determined after lab review or as needed.  May refer to urologist after review of studies and after assessment of how you respond to treatment.

## 2020-04-02 NOTE — Progress Notes (Signed)
   Subjective:    Patient ID: Marc Allen, male    DOB: [birth year], 59 y.o.   MRN: [id number]  HPI  Virtual Visit via Video Note  I connected with Marc Allen on 04/02/20 at  4:20 PM EDT by a video enabled telemedicine application and verified that I am speaking with the correct person using two identifiers.  Location: Patient: home Provider: office    Pt did not check his virals today.   I discussed the limitations of evaluation and management by telemedicine and the availability of in person appointments. The patient expressed understanding and agreed to proceed.  History of Present Illness: Pt states he has some similar symptoms as he had last march 2020. He states strong odor to urine and light appearance to urine. No frequent urination. Some hesitant urine flow. 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 States does have some perineum discomfort. Also some suprapubic discomfort.   Most recently his symptoms more prominent past week.   Hesitant urine flow has been going on for about 3 months. Some odor to urine for a while    Observations/Objective: General-no acute distress, pleasant, oriented. Lungs- on inspection lungs appear unlabored. Neck- no tracheal deviation or jvd on inspection. Neuro- gross motor function appears intact.  Assessment and Plan: You do appear to have prostatitis.  Will prescribe Cipro antibiotic and recommend that you hydrate well.  We will get ua, urine, urine culture,  CBC and PSA lab work tomorrow. Start cipro  May refer to urologist after review of studies and after assessment of how you respond to treatment.  Time spent with patient today was  20 minutes which consisted of chart review, discussing diagnosis, work up, treatment answering questions, explain plan going forwared and documentation.  Esperanza Richters, PA-C   Follow Up Instructions:    I discussed the assessment and treatment plan with the patient. The patient was  provided an opportunity to ask questions and all were answered. The patient agreed with the plan and demonstrated an understanding of the instructions.   The patient was advised to call back or seek an in-person evaluation if the symptoms worsen or if the condition fails to improve as anticipated.     Esperanza Richters, PA-C    Review of Systems     Objective:   Physical Exam        Assessment & Plan:

## 2020-04-03 ENCOUNTER — Other Ambulatory Visit: Payer: Self-pay

## 2020-04-03 ENCOUNTER — Other Ambulatory Visit: Payer: BC Managed Care – PPO

## 2020-04-03 DIAGNOSIS — N390 Urinary tract infection, site not specified: Secondary | ICD-10-CM

## 2020-04-03 DIAGNOSIS — N419 Inflammatory disease of prostate, unspecified: Secondary | ICD-10-CM

## 2020-04-04 ENCOUNTER — Telehealth: Payer: Self-pay | Admitting: Medical

## 2020-04-04 DIAGNOSIS — N400 Enlarged prostate without lower urinary tract symptoms: Secondary | ICD-10-CM

## 2020-04-04 DIAGNOSIS — R350 Frequency of micturition: Secondary | ICD-10-CM

## 2020-04-04 LAB — URINE CULTURE
MICRO NUMBER:: 11096014
Result:: NO GROWTH
SPECIMEN QUALITY:: ADEQUATE

## 2020-04-04 LAB — CBC WITH DIFFERENTIAL/PLATELET
Absolute Monocytes: 566 cells/uL (ref 200–950)
Basophils Absolute: 50 cells/uL (ref 0–200)
Basophils Relative: 0.9 %
Eosinophils Absolute: 90 cells/uL (ref 15–500)
Eosinophils Relative: 1.6 %
HCT: 44.7 % (ref 38.5–50.0)
Hemoglobin: 15.5 g/dL (ref 13.2–17.1)
Lymphs Abs: 2061 cells/uL (ref 850–3900)
MCH: 33.4 pg — ABNORMAL HIGH (ref 27.0–33.0)
MCHC: 34.7 g/dL (ref 32.0–36.0)
MCV: 96.3 fL (ref 80.0–100.0)
MPV: 11.2 fL (ref 7.5–12.5)
Monocytes Relative: 10.1 %
Neutro Abs: 2834 cells/uL (ref 1500–7800)
Neutrophils Relative %: 50.6 %
Platelets: 268 10*3/uL (ref 140–400)
RBC: 4.64 10*6/uL (ref 4.20–5.80)
RDW: 14 % (ref 11.0–15.0)
Total Lymphocyte: 36.8 %
WBC: 5.6 10*3/uL (ref 3.8–10.8)

## 2020-04-04 LAB — PSA: PSA: 0.92 ng/mL (ref <=4.0)

## 2020-04-04 MED ORDER — TAMSULOSIN HCL 0.4 MG PO CAPS: 0.4000 mg | ORAL_CAPSULE | Freq: Every day | ORAL | 3 refills | Status: DC

## 2020-04-04 NOTE — Telephone Encounter (Signed)
Opened to refer. 

## 2020-04-04 NOTE — Telephone Encounter (Signed)
Referral to urologist placed. 

## 2020-07-05 ENCOUNTER — Other Ambulatory Visit: Payer: Self-pay | Admitting: Medical

## 2021-01-13 ENCOUNTER — Ambulatory Visit: Payer: BC Managed Care – PPO | Admitting: Internal Medicine

## 2021-01-13 ENCOUNTER — Encounter: Payer: Self-pay | Admitting: Internal Medicine

## 2021-01-13 ENCOUNTER — Other Ambulatory Visit: Payer: Self-pay

## 2021-01-13 VITALS — BP 122/80 | HR 70 | Temp 98.0°F | Resp 16 | Ht 72.0 in | Wt 186.4 lb

## 2021-01-13 DIAGNOSIS — S4382XA Sprain of other specified parts of left shoulder girdle, initial encounter: Secondary | ICD-10-CM | POA: Diagnosis not present

## 2021-01-13 DIAGNOSIS — T148XXA Other injury of unspecified body region, initial encounter: Secondary | ICD-10-CM | POA: Diagnosis not present

## 2021-01-13 NOTE — Patient Instructions (Signed)
Just watch the area  You can get over-the-counter hydrocortisone cream 1% apply twice a day  If he is not back to normal in few days let us know

## 2021-01-13 NOTE — Progress Notes (Signed)
Subjective:    Patient ID: Marc Allen, male    DOB: 08-24-1960, 60 y.o.   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 Does not recall any injury. No previous history of clots Bump noted  2 to 3 days ago.  Also, 2 months history of pain located at the L trapezoid area. No radiation to the neck or shoulder. No upper or lower extremity paresthesias.   Review of Systems See above   Past Medical History:  Diagnosis Date   Elevated LFTs 03/31/2017   Flat feet    GERD (gastroesophageal reflux disease)    not currenlty on any meds   Hyperlipidemia, mild 02/13/2016   Plantar fasciitis    Preventative health care 05/03/2011   Seasonal allergies    Tachycardia 09/23/2016   TMJ syndrome     Past Surgical History:  Procedure Laterality Date   COLONOSCOPY     ESOPHAGOGASTRODUODENOSCOPY     ESOPHAGOGASTRODUODENOSCOPY (EGD) WITH PROPOFOL N/A 02/14/2019   Procedure: ESOPHAGOGASTRODUODENOSCOPY (EGD) WITH PROPOFOL;  Surgeon: Shellia Cleverly, DO;  Location: [facility];  Service: Gastroenterology;  Laterality: N/A;   TRANSORAL INCISIONLESS FUNDOPLICATION N/A 02/14/2019   Procedure: TRANSORAL INCISIONLESS FUNDOPLICATION;  Surgeon: Shellia Cleverly, DO;  Location: [facility];  Service: Gastroenterology;  Laterality: N/A;    Allergies as of 01/13/2021       Reactions   Other    Hayfever        Medication List        Accurate as of January 13, 2021 11:59 PM. If you have any questions, ask your nurse or doctor.          STOP taking these medications    ciprofloxacin 500 MG tablet Commonly known as: CIPRO Stopped by: Willow Ora, MD   fluticasone 50 MCG/ACT nasal spray Commonly known as: FLONASE Stopped by: Willow Ora, MD   simethicone 80 MG chewable tablet Commonly known as: MYLICON Stopped by: Willow Ora, MD    tamsulosin 0.4 MG Caps capsule Commonly known as: FLOMAX Stopped by: Willow Ora, MD       TAKE these medications    famotidine 20 MG tablet Commonly known as: Pepcid Take 1 tablet (20 mg total) by mouth daily as needed for heartburn or indigestion.   pantoprazole 40 MG tablet Commonly known as: PROTONIX Take 1 tablet (40 mg total) by mouth daily.           Objective:   Physical Exam Skin:       BP 122/80 (BP Location: Left Arm, Patient Position: Sitting, Cuff Size: Small)   Pulse 70   Temp 98 F (36.7 C) (Oral)   Resp 16   Ht 6' (1.829 m)   Wt 186 lb 6 oz (84.5 kg)   SpO2 98%   BMI 25.28 kg/m  General:   Well developed, NAD, BMI noted. HEENT:  Normocephalic . Face symmetric, atraumatic Lower extremities: no pretibial edema bilaterally  Skin: see graphic Neurologic:  alert & oriented X3.  Speech normal, gait appropriate for age and unassisted Psych--  Cognition and judgment appear intact.  Cooperative with normal attention span and concentration.  Behavior appropriate. No anxious or depressed appearing.      Assessment     60 year old male, PMH includes GERD, LUTS, presents with: Excoriation, L leg: No history of  injury, no obvious infectious today.  Recommend hydrocortisone cream and observation.  Patient is extremely concerned about DVTs, red flags discussed with the patient that should indicate a prompt evaluation. Trapezoid sprain: Patient is a truck driver, has pain at the left upper trapezoid x2 months, recommend stretching, heating pad, Tylenol as needed, call if no better   This visit occurred during the SARS-CoV-2 public health emergency.  Safety protocols were in place, including screening questions prior to the visit, additional usage of staff PPE, and extensive cleaning of exam room while observing appropriate contact time as indicated for disinfecting solutions.

## 2021-06-23 ENCOUNTER — Telehealth (INDEPENDENT_AMBULATORY_CARE_PROVIDER_SITE_OTHER): Payer: BC Managed Care – PPO | Admitting: Family

## 2021-06-23 ENCOUNTER — Encounter: Payer: Self-pay | Admitting: Family

## 2021-06-23 VITALS — Ht 72.0 in | Wt 186.3 lb

## 2021-06-23 DIAGNOSIS — R053 Chronic cough: Secondary | ICD-10-CM | POA: Diagnosis not present

## 2021-06-23 NOTE — Progress Notes (Signed)
MyChart Video Visit    Virtual Visit via Video Note   This visit type was conducted due to national recommendations for restrictions regarding the COVID-19 Pandemic (e.g. social distancing) in an effort to limit this patient's exposure and mitigate transmission in our community. This patient is at least at moderate risk for complications without adequate follow up. This format is felt to be most appropriate for this patient at this time. Physical exam was limited by quality of the video and audio technology used for the visit. CMA was able to get the patient set up on a video visit.  Patient location: Home. Patient and provider in visit Provider location: Office  I discussed the limitations of evaluation and management by telemedicine and the availability of in person appointments. The patient expressed understanding and agreed to proceed.  Visit Date: 06/23/2021  Today's healthcare provider: Dulce Sellar, NP     Subjective:    Patient ID: Marc Allen, male    DOB: [birth year], 61 y.o.   MRN: [id number]  Chief Complaint  Patient presents with   Cough    Symptoms started last Wednesday. He has taken halls, cough drops, Nyquil. Covid test negative on Sunday. He denies fever and headache.    Sore Throat   Nasal Congestion    HPI Cough: Patient complains of nasal congestion, productive cough, and sore throat.  Symptoms began 1 week ago.  The cough is without wheezing, dyspnea or hemoptysis and is aggravated by infection Associated symptoms include: mild chest tightness . Patient does not have new pets. Patient does not have a history of asthma. Patient does not have a history of environmental allergens. Patient has not had recent travel. Patient does not have a history of smoking.   Past Medical History:  Diagnosis Date   Elevated LFTs 03/31/2017   Flat feet    GERD (gastroesophageal reflux disease)    not currenlty on any meds   Hyperlipidemia, mild 02/13/2016    Plantar fasciitis    Preventative health care 05/03/2011   Seasonal allergies    Tachycardia 09/23/2016   TMJ syndrome     Past Surgical History:  Procedure Laterality Date   COLONOSCOPY     ESOPHAGOGASTRODUODENOSCOPY     ESOPHAGOGASTRODUODENOSCOPY (EGD) WITH PROPOFOL N/A 02/14/2019   Procedure: ESOPHAGOGASTRODUODENOSCOPY (EGD) WITH PROPOFOL;  Surgeon: Shellia Cleverly, DO;  Location: [facility];  Service: Gastroenterology;  Laterality: N/A;   TRANSORAL INCISIONLESS FUNDOPLICATION N/A 02/14/2019   Procedure: TRANSORAL INCISIONLESS FUNDOPLICATION;  Surgeon: Shellia Cleverly, DO;  Location: [facility];  Service: Gastroenterology;  Laterality: N/A;    Outpatient Medications Prior to Visit  Medication Sig Dispense Refill   famotidine (PEPCID) 20 MG tablet Take 1 tablet (20 mg total) by mouth daily as needed for heartburn or indigestion. (Patient not taking: Reported on 01/13/2021) 60 tablet 3   pantoprazole (PROTONIX) 40 MG tablet Take 1 tablet (40 mg total) by mouth daily. (Patient not taking: Reported on 01/13/2021) 60 tablet 3   No facility-administered medications prior to visit.    Allergies  Allergen Reactions   Other     Hayfever       Objective:     Physical Exam Vitals and nursing note reviewed.  Constitutional:      General: She is not in acute distress.    Appearance: Normal appearance.  HENT:     Head: Normocephalic.  Pulmonary:     Effort: No respiratory distress.  Musculoskeletal:     Cervical back: Normal  range of motion.  Skin:    General: Skin is dry.     Coloration: Skin is not pale.  Neurological:     Mental Status: She is alert and oriented to person, place, and time.  Psychiatric:        Mood and Affect: Mood normal.   Ht 6' (1.829 m)    Wt 186 lb 4.6 oz (84.5 kg)    BMI 25.27 kg/m   Wt Readings from Last 3 Encounters:  06/23/21 186 lb 4.6 oz (84.5 kg)  01/13/21 186 lb 6 oz (84.5 kg)  04/11/19 176 lb (79.8 kg)       Assessment & Plan:    Problem List Items Addressed This Visit       Other   Persistent cough - Primary    advised on drinking more water instead of gingerale and gatorade. reports nasal drainage and postnasal drip and not taking any OTC med for this. Advised to pick up generic Sudafed or Claritin D from behind pharmacy counter, discussed use & SE.        I discussed the assessment and treatment plan with the patient. The patient was provided an opportunity to ask questions and all were answered. The patient agreed with the plan and demonstrated an understanding of the instructions.   The patient was advised to call back or seek an in-person evaluation if the symptoms worsen or if the condition fails to improve as anticipated.  I provided 21 minutes of face-to-face time during this encounter.   Dulce Sellar, NP [facility] [place] [phone] (phone) [phone] (fax)  [facility]

## 2021-06-23 NOTE — Assessment & Plan Note (Addendum)
advised on drinking more water instead of gingerale and gatorade. reports nasal drainage and postnasal drip and not taking any OTC med for this. Advised to pick up generic Sudafed or Claritin D from behind pharmacy counter, discussed use & SE.

## 2022-03-12 ENCOUNTER — Encounter: Payer: Self-pay | Admitting: Family Medicine

## 2022-03-12 ENCOUNTER — Telehealth: Payer: BC Managed Care – PPO | Admitting: Family Medicine

## 2022-03-12 DIAGNOSIS — U071 COVID-19: Secondary | ICD-10-CM

## 2022-03-12 MED ORDER — BENZONATATE 200 MG PO CAPS: 200.0000 mg | ORAL_CAPSULE | Freq: Two times a day (BID) | ORAL | 0 refills | Status: DC | PRN

## 2022-03-12 NOTE — Progress Notes (Signed)
Chief Complaint  Patient presents with   Covid Positive    03/12/22 -- fever , cough    Marc Allen here for URI complaints. Due to COVID-19 pandemic, we are interacting via web portal for an electronic face-to-face visit. I verified patient's ID using 2 identifiers. Patient agreed to proceed with visit via this method. Patient is at home, I am at office. Patient and I are present for visit.   Duration: 1 day  Associated symptoms: Fever (99 F), sinus congestion, sinus pain, itchy watery eyes, sore throat, myalgia, and coughing Denies: rhinorrhea, ear pain, ear drainage, wheezing, shortness of breath, and N/V/D, loss of taste Treatment to date: Tylenol Sick contacts: No Tested + for covid this AM.  Past Medical History:  Diagnosis Date   Elevated LFTs 03/31/2017   Flat feet    GERD (gastroesophageal reflux disease)    not currenlty on any meds   Hyperlipidemia, mild 02/13/2016   Plantar fasciitis    Preventative health care 05/03/2011   Seasonal allergies    TMJ syndrome     Objective No conversational dyspnea Age appropriate judgment and insight Nml affect and mood  COVID-19 - Plan: benzonatate (TESSALON) 200 MG capsule  Continue to push fluids, practice good hand hygiene, cover mouth when coughing. Discussed CDC quarantining guidelines.  F/u prn. If starting to experience irreplaceable fluid loss, shaking, or shortness of breath, seek immediate care. Pt voiced understanding and agreement to the plan.  [place], DO 03/12/22 2:53 PM

## 2022-06-11 ENCOUNTER — Ambulatory Visit: Payer: BC Managed Care – PPO | Admitting: Medical

## 2022-06-11 ENCOUNTER — Encounter: Payer: Self-pay | Admitting: Medical

## 2022-06-11 VITALS — BP 121/88 | HR 62 | Temp 98.1°F | Ht 73.0 in | Wt 186.8 lb

## 2022-06-11 DIAGNOSIS — M2550 Pain in unspecified joint: Secondary | ICD-10-CM | POA: Diagnosis not present

## 2022-06-11 DIAGNOSIS — M25511 Pain in right shoulder: Secondary | ICD-10-CM | POA: Diagnosis not present

## 2022-06-11 DIAGNOSIS — M25512 Pain in left shoulder: Secondary | ICD-10-CM | POA: Diagnosis not present

## 2022-06-11 MED ORDER — PREDNISONE 10 MG (21) PO TBPK: ORAL_TABLET | ORAL | 0 refills | Status: DC

## 2022-06-11 NOTE — Progress Notes (Signed)
Subjective:    Patient ID: Marc Allen, male    DOB: [birth year], 61 y.o.   MRN: [id number]  HPI  Pt in with bilateral shoulder pain. Pt states pain worse on rt side. Motrin seems to help. Pain noticed more at night. States decreased range of motion.  In the past back in 2017. He had probable frozen shoulder back then. He states went to PT and got better.   Recently no injury or fall. 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  Pt states last took motrin about 10 hours ago.   For past 2 month had daily shoulder pain with movement. When not on motrin pain is high level.  He states motrin at times seems to make gerd worse.     Review of Systems  Constitutional:  Negative for chills, fatigue and fever.  HENT:  Negative for dental problem.   Respiratory:  Negative for cough, chest tightness, shortness of breath and wheezing.   Cardiovascular:  Negative for chest pain and palpitations.  Gastrointestinal:  Negative for abdominal pain, constipation and nausea.  Genitourinary:  Negative for dysuria and frequency.  Musculoskeletal:  Negative for back pain and myalgias.       Shoulder pain.  Neurological:  Negative for dizziness, seizures, speech difficulty, light-headedness and headaches.  Hematological:  Negative for adenopathy. Does not bruise/bleed easily.  Psychiatric/Behavioral:  Negative for behavioral problems and decreased concentration.     Past Medical History:  Diagnosis Date   Elevated LFTs 03/31/2017   Flat feet    GERD (gastroesophageal reflux disease)    not currenlty on any meds   Hyperlipidemia, mild 02/13/2016   Plantar fasciitis    Preventative health care 05/03/2011   Seasonal allergies    TMJ syndrome      Social History   Socioeconomic History   Marital status: Married    Spouse name: Not on file   Number of children: 1   Years of education: Not on file   Highest education level: Not on file  Occupational History   Not on file  Tobacco Use   Smoking  status: Never   Smokeless tobacco: Never  Vaping Use   Vaping Use: Never used  Substance and Sexual Activity   Alcohol use: No   Drug use: No   Sexual activity: Yes    Comment: works at [employer] lives with wife, no dietary restrictions  Other Topics Concern   Not on file  Social History Narrative   [employer] Photographer   Married 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   One son   2 caffeinated beverages daily   Never smoker   No alcohol or drug use or other tobacco      Social Determinants of Corporate investment banker Strain: Not on file  Food Insecurity: Not on file  Transportation Needs: Not on file  Physical Activity: Not on file  Stress: Not on file  Social Connections: Not on file  Intimate Partner Violence: Not on file    Past Surgical History:  Procedure Laterality Date   COLONOSCOPY     ESOPHAGOGASTRODUODENOSCOPY     ESOPHAGOGASTRODUODENOSCOPY (EGD) WITH PROPOFOL N/A 02/14/2019   Procedure: ESOPHAGOGASTRODUODENOSCOPY (EGD) WITH PROPOFOL;  Surgeon: Shellia Cleverly, DO;  Location: [facility];  Service: Gastroenterology;  Laterality: N/A;   TRANSORAL INCISIONLESS FUNDOPLICATION N/A 02/14/2019   Procedure: TRANSORAL INCISIONLESS FUNDOPLICATION;  Surgeon: Shellia Cleverly, DO;  Location: [facility];  Service: Gastroenterology;  Laterality: N/A;    Family History  Problem Relation  Age of Onset   Alcohol abuse Paternal Grandfather    Stroke Paternal Grandfather    Hypertension Father    Prostatitis Father    Atrial fibrillation Father    Arthritis Father        DDD   Autism Son    Dementia Maternal Grandmother    Other Mother        Died in car accident   Alcohol abuse Paternal Uncle    Arthritis/Rheumatoid Paternal Uncle    Diabetes Paternal Uncle    Lung cancer Maternal Aunt        smoker   Seizures Son        age 64   Cancer Maternal Uncle        bone cancer, smoker   Colon cancer Neg Hx    Prostate cancer Neg Hx    Rectal cancer Neg Hx    Stomach cancer Neg Hx    Esophageal  cancer Neg Hx     Allergies  Allergen Reactions   Other     Hayfever    Current Outpatient Medications on File Prior to Visit  Medication Sig Dispense Refill   famotidine (PEPCID) 10 MG tablet Take 10 mg by mouth 2 (two) times daily. As needed     ibuprofen (ADVIL) 400 MG tablet Take 400 mg by mouth every 6 (six) hours as needed.     pantoprazole (PROTONIX) 40 MG tablet Take 1 tablet (40 mg total) by mouth daily. 60 tablet 3   No current facility-administered medications on file prior to visit.    BP 121/88 (BP Location: Right Arm, Patient Position: Sitting, Cuff Size: Normal)   Pulse 62   Temp 98.1 F (36.7 C) (Oral)   Ht 6\' 1"  (1.854 m)   Wt 186 lb 12.8 oz (84.7 kg)   SpO2 99%   BMI 24.65 kg/m        Objective:   Physical Exam  General- No acute distress. Pleasant patient. Neck- Full range of motion, no jvd Lungs- Clear, even and unlabored. Heart- regular rate and rhythm. Neurologic- CNII- XII grossly intact.  Left shoulder- mild anterior aspect tender to palpaton. Pain on rom mild presentl. Rt shoulder- limited abduction and hard lift arm above shoulder.      Assessment & Plan:   Patient Instructions  For bilateral shoulder pain persisting for 2 or more months will rx taper 6 day taper dose of prednisone.(Not to use nsaids while on prednisone)  For joint pain and elevated RF lab in past will get RF today and sed rate.  Refer to sport medicine MD.  Follow up date to be determined after labs and sport med note review. Sooner if needed.    , PA-C

## 2022-06-11 NOTE — Patient Instructions (Addendum)
For bilateral shoulder pain persisting for 2 or more months will rx taper 6 day taper dose of prednisone.(Not to use nsaids while on prednisone)  For joint pain and elevated RF lab in past will get RF today and sed rate.  Refer to sport medicine MD.  Follow up date to be determined after labs and sport med note review. Sooner if needed.

## 2022-06-12 LAB — RHEUMATOID FACTOR: Rheumatoid fact SerPl-aCnc: 29 IU/mL — ABNORMAL HIGH (ref <14)

## 2022-06-12 LAB — SEDIMENTATION RATE: Sed Rate: 1 mm/hr (ref 0–20)

## 2022-06-23 ENCOUNTER — Ambulatory Visit: Payer: BC Managed Care – PPO | Admitting: Family Medicine

## 2022-06-24 ENCOUNTER — Encounter: Payer: Self-pay | Admitting: Family Medicine

## 2022-06-24 ENCOUNTER — Ambulatory Visit: Payer: BC Managed Care – PPO | Admitting: Family Medicine

## 2022-06-24 ENCOUNTER — Ambulatory Visit: Payer: Self-pay

## 2022-06-24 VITALS — BP 140/92 | Ht 72.0 in | Wt 186.0 lb

## 2022-06-24 DIAGNOSIS — M778 Other enthesopathies, not elsewhere classified: Secondary | ICD-10-CM | POA: Diagnosis not present

## 2022-06-24 DIAGNOSIS — M25511 Pain in right shoulder: Secondary | ICD-10-CM

## 2022-06-24 MED ORDER — TRIAMCINOLONE ACETONIDE 40 MG/ML IJ SUSP
40.0000 mg | Freq: Once | INTRAMUSCULAR | Status: AC
  Administered 2022-06-24: 40 mg via INTRA_ARTICULAR

## 2022-06-24 NOTE — Patient Instructions (Signed)
Nice to meet you Please try heat before exercise and ice after Please try the exercises   Please send me a message in MyChart with any questions or updates.  Please see me back in 4 weeks.   --Dr. Kamren Heintzelman  

## 2022-06-24 NOTE — Progress Notes (Signed)
  Marc Allen - 62 y.o. male MRN [id number]  Date of birth: 1961-04-03  SUBJECTIVE:  Including CC & ROS.  No chief complaint on file.   Marc Allen is a 62 y.o. male that is  presenting with acute right shoulder pain. Has been ongoing for 2 months. No injury or previous surgery. Localized to the shoulder joint.   Review of Systems See HPI   HISTORY: Past Medical, Surgical, Social, and Family History Reviewed & Updated per EMR.   Pertinent Historical Findings include:  Past Medical History:  Diagnosis Date   Elevated LFTs 03/31/2017   Flat feet    GERD (gastroesophageal reflux disease)    not currenlty on any meds   Hyperlipidemia, mild 02/13/2016   Plantar fasciitis    Preventative health care 05/03/2011   Seasonal allergies    TMJ syndrome     Past Surgical History:  Procedure Laterality Date   COLONOSCOPY     ESOPHAGOGASTRODUODENOSCOPY     ESOPHAGOGASTRODUODENOSCOPY (EGD) WITH PROPOFOL N/A 02/14/2019   Procedure: ESOPHAGOGASTRODUODENOSCOPY (EGD) WITH PROPOFOL;  Surgeon: Lavena Bullion, DO;  Location: [facility];  Service: Gastroenterology;  Laterality: N/A;   TRANSORAL INCISIONLESS FUNDOPLICATION N/A 11/14/8364   Procedure: TRANSORAL INCISIONLESS FUNDOPLICATION;  Surgeon: Lavena Bullion, DO;  Location: [facility];  Service: Gastroenterology;  Laterality: N/A;     PHYSICAL EXAM:  VS: BP (!) 140/92   Ht 6' (1.829 m)   Wt 186 lb (84.4 kg)   BMI 25.23 kg/m  Physical Exam Gen: NAD, alert, cooperative with exam, well-appearing MSK:  Neurovascularly intact    Limited ultrasound: right shoulder pain:  Mild effusion appreciated within the biceps tendon  Normal appearing subscapularis  No changes in the supraspinatus  Thickening in the posterior glenohumeral joint   Summary: findings consistent with capsulitis of right shoulder  Ultrasound and interpretation by Clearance Coots, MD   Aspiration/Injection Procedure Note Marc Allen February 23, 1961  Procedure: Injection Indications: right shoulder  Procedure Details Consent: Risks of procedure as well as the alternatives and risks of each were explained to the (patient/caregiver).  Consent for procedure obtained. Time Out: Verified patient identification, verified procedure, site/side was marked, verified correct patient position, special equipment/implants available, medications/allergies/relevent history reviewed, required imaging and test results available.  Performed.  The area was cleaned with iodine and alcohol swabs.    The right glenohumeral joint was injected with 3 cc's of 1% lidocaine on a 22-gauge 3-1/2 inch needle.  The syringe was switched to mixture containing 3 cc's of 1% lidocaine,  1 cc's of 40 mg Kenalog and 3 cc's of 0.25% bupivacaine was injected.  Ultrasound was used. Images were obtained in long views showing the injection.     A sterile dressing was applied.  Patient did tolerate procedure well.   ASSESSMENT & PLAN:   Capsulitis of right shoulder Acutely occurring. Having limited external rotation on exam.  - counseled on home exercise therapy and supportive care - injection  - could consider further imaging or PT

## 2022-06-24 NOTE — Assessment & Plan Note (Signed)
Acutely occurring. Having limited external rotation on exam.  - counseled on home exercise therapy and supportive care - injection  - could consider further imaging or PT

## 2022-07-21 ENCOUNTER — Encounter: Payer: Self-pay | Admitting: Internal Medicine

## 2022-07-21 ENCOUNTER — Ambulatory Visit: Payer: BC Managed Care – PPO | Admitting: Internal Medicine

## 2022-07-21 ENCOUNTER — Other Ambulatory Visit (HOSPITAL_COMMUNITY)
Admission: RE | Admit: 2022-07-21 | Discharge: 2022-07-21 | Disposition: A | Discharge status: Home / Self Care | Payer: BC Managed Care – PPO | Source: Ambulatory Visit | Attending: Internal Medicine | Admitting: Internal Medicine

## 2022-07-21 VITALS — BP 122/84 | HR 82 | Temp 98.2°F | Resp 16 | Ht 72.0 in | Wt 186.4 lb

## 2022-07-21 DIAGNOSIS — N390 Urinary tract infection, site not specified: Secondary | ICD-10-CM | POA: Diagnosis not present

## 2022-07-21 DIAGNOSIS — N410 Acute prostatitis: Secondary | ICD-10-CM | POA: Diagnosis not present

## 2022-07-21 DIAGNOSIS — R369 Urethral discharge, unspecified: Secondary | ICD-10-CM | POA: Diagnosis present

## 2022-07-21 DIAGNOSIS — R319 Hematuria, unspecified: Secondary | ICD-10-CM | POA: Diagnosis not present

## 2022-07-21 LAB — POCT URINALYSIS DIPSTICK
Bilirubin, UA: NEGATIVE
Glucose, UA: NEGATIVE
Ketones, UA: NEGATIVE
Nitrite, UA: NEGATIVE
Protein, UA: POSITIVE — AB
Spec Grav, UA: 1.025 (ref 1.010–1.025)
Urobilinogen, UA: 0.2 E.U./dL
pH, UA: 6 (ref 5.0–8.0)

## 2022-07-21 MED ORDER — SULFAMETHOXAZOLE-TRIMETHOPRIM 800-160 MG PO TABS: 1.0000 | ORAL_TABLET | Freq: Two times a day (BID) | ORAL | 0 refills | Status: DC

## 2022-07-21 NOTE — Patient Instructions (Addendum)
Drink plenty of fluids  Start taking Bactrim 1 tablet twice daily for 2 weeks.  Depending on results you may need to take it for few additional weeks.    GO TO THE LAB : Get the blood work     [place], [place] Come back for checkup in 2 months

## 2022-07-21 NOTE — Progress Notes (Unsigned)
Subjective:    Patient ID: Marc Allen, male    DOB: 05/08/1961, 62 y.o.   MRN: [id number]  DOS:  07/21/2022 Type of visit - description: Acute  Has noticed some strange odor in the urine for the last couple of months. 3 days ago he developed urinary frequency and urgency.  No significant dysuria. He had fever 2 days ago Body aches for the last 2 nights (no respiratory symptoms) No nausea vomiting, had diarrhea x 1 2 days ago. Has some suprapubic pain. No gross hematuria I ask about penile discharge and he said he has noted dry clear drops in the underwear, not unusual when he is diagnosed with a UTI. No genital rash  Review of Systems See above   Past Medical History:  Diagnosis Date   Elevated LFTs 03/31/2017   Flat feet    GERD (gastroesophageal reflux disease)    not currenlty on any meds   Hyperlipidemia, mild 02/13/2016   Plantar fasciitis    Preventative health care 05/03/2011   Seasonal allergies    TMJ syndrome     Past Surgical History:  Procedure Laterality Date   COLONOSCOPY     ESOPHAGOGASTRODUODENOSCOPY     ESOPHAGOGASTRODUODENOSCOPY (EGD) WITH PROPOFOL N/A 02/14/2019   Procedure: ESOPHAGOGASTRODUODENOSCOPY (EGD) WITH PROPOFOL;  Surgeon: Lavena Bullion, DO;  Location: [facility];  Service: Gastroenterology;  Laterality: N/A;   TRANSORAL INCISIONLESS FUNDOPLICATION N/A 11/15/2295   Procedure: TRANSORAL INCISIONLESS FUNDOPLICATION;  Surgeon: Lavena Bullion, DO;  Location: [facility];  Service: Gastroenterology;  Laterality: N/A;    Current Outpatient Medications  Medication Instructions   famotidine (PEPCID) 10 mg, Oral, 2 times daily, As needed    ibuprofen (ADVIL) 400 mg, Every 6 hours PRN   pantoprazole (PROTONIX) 40 mg, Oral, Daily   predniSONE (STERAPRED UNI-PAK 21 TAB) 10 MG (21) TBPK tablet Standard Taper over 6 days.       Objective:   Physical Exam BP 122/84   Pulse 82   Temp 98.2 F (36.8 C) (Oral)   Resp 16   Ht 6' (1.829 m)    Wt 186 lb 6 oz (84.5 kg)   SpO2 98%   BMI 25.28 kg/m  General:   Well developed, NAD, BMI noted.  HEENT:  Normocephalic . Face symmetric, atraumatic Lungs:  CTA B Normal respiratory effort, no intercostal retractions, no accessory muscle use. Heart: RRR,  no murmur.  Abdomen:  Not distended, soft, non-tender. No rebound or rigidity.   Skin: Not pale. Not jaundice GU: Normal  testicles, not particularly tender; penis without a rash.  A small amount of whitish/transparent discharge noted from the penis DRE: Normal sphincter tone, no stools, prostate: Slightly larger on the right, not nodular.  Slightly tender?. Lower extremities: no pretibial edema bilaterally  Neurologic:  alert & oriented X3.  Speech normal, gait appropriate for age and unassisted Psych--  Cognition and judgment appear intact.  Cooperative with normal attention span and concentration.  Behavior appropriate. No anxious or depressed appearing.     Assessment     62 year old male, PMH includes GERD, history of prostatitis: Acute in 2020 with a PSA of 16, saw urology 2022, diagnosed with chronic prostatitis, see note.  He presents with:  Prostatitis acute on chronic: LUTS started 3 days ago, prior to that was barely symptomatic. He had fever at least 1 time, today he does not look toxic today, exam shows a very small amount of penile discharge (per patient this is typical for his acute  prostatitis episodes) and the prostate is slightly enlarged and minimally tender on the right side. Udip: + Welltuss and red cells Plan: UA, urine culture, BMP CBC PSA.  Also GNC swab, RPR and HIV. Bactrim x 2 weeks, for longer treatment if PSA elevated Strongly encouraged to come back for a follow-up in 2 months

## 2022-07-22 LAB — CBC WITH DIFFERENTIAL/PLATELET
Basophils Absolute: 0.1 10*3/uL (ref 0.0–0.1)
Basophils Relative: 0.5 % (ref 0.0–3.0)
Eosinophils Absolute: 0.1 10*3/uL (ref 0.0–0.7)
Eosinophils Relative: 0.7 % (ref 0.0–5.0)
HCT: 43.8 % (ref 39.0–52.0)
Hemoglobin: 14.8 g/dL (ref 13.0–17.0)
Lymphocytes Relative: 11.6 % — ABNORMAL LOW (ref 12.0–46.0)
Lymphs Abs: 1.2 10*3/uL (ref 0.7–4.0)
MCHC: 33.8 g/dL (ref 30.0–36.0)
MCV: 100.2 fl — ABNORMAL HIGH (ref 78.0–100.0)
Monocytes Absolute: 1.3 10*3/uL — ABNORMAL HIGH (ref 0.1–1.0)
Monocytes Relative: 12.1 % — ABNORMAL HIGH (ref 3.0–12.0)
Neutro Abs: 7.9 10*3/uL — ABNORMAL HIGH (ref 1.4–7.7)
Neutrophils Relative %: 75.1 % (ref 43.0–77.0)
Platelets: 271 10*3/uL (ref 150.0–400.0)
RBC: 4.37 Mil/uL (ref 4.22–5.81)
RDW: 14.6 % (ref 11.5–15.5)
WBC: 10.5 10*3/uL (ref 4.0–10.5)

## 2022-07-22 LAB — COMPREHENSIVE METABOLIC PANEL
ALT: 34 U/L (ref 0–53)
AST: 25 U/L (ref 0–37)
Albumin: 4.4 g/dL (ref 3.5–5.2)
Alkaline Phosphatase: 74 U/L (ref 39–117)
BUN: 14 mg/dL (ref 6–23)
CO2: 31 mEq/L (ref 19–32)
Calcium: 9.2 mg/dL (ref 8.4–10.5)
Chloride: 104 mEq/L (ref 96–112)
Creatinine, Ser: 1.27 mg/dL (ref 0.40–1.50)
GFR: 61.96 mL/min (ref >60.00)
Glucose, Bld: 70 mg/dL (ref 70–99)
Potassium: 3.8 mEq/L (ref 3.5–5.1)
Sodium: 145 mEq/L (ref 135–145)
Total Bilirubin: 0.9 mg/dL (ref 0.2–1.2)
Total Protein: 7.2 g/dL (ref 6.0–8.3)

## 2022-07-22 LAB — URINALYSIS, ROUTINE W REFLEX MICROSCOPIC
Bilirubin Urine: NEGATIVE
Nitrite: NEGATIVE
Specific Gravity, Urine: >=1.03 — AB (ref 1.000–1.030)
Total Protein, Urine: 30 — AB
Urine Glucose: NEGATIVE
Urobilinogen, UA: 2 — AB (ref 0.0–1.0)
pH: 5.5 (ref 5.0–8.0)

## 2022-07-22 LAB — PSA: PSA: 33.27 ng/mL — ABNORMAL HIGH (ref 0.10–4.00)

## 2022-07-22 LAB — RPR: RPR Ser Ql: NONREACTIVE

## 2022-07-22 LAB — HIV ANTIBODY (ROUTINE TESTING W REFLEX): HIV 1&2 Ab, 4th Generation: NONREACTIVE

## 2022-07-23 LAB — URINE CULTURE
MICRO NUMBER:: 14525836
SPECIMEN QUALITY:: ADEQUATE

## 2022-07-23 MED ORDER — SULFAMETHOXAZOLE-TRIMETHOPRIM 800-160 MG PO TABS: 1.0000 | ORAL_TABLET | Freq: Two times a day (BID) | ORAL | 0 refills | Status: DC

## 2022-07-23 NOTE — Addendum Note (Signed)
Addended byDamita Dunnings D on: 07/23/2022 01:02 PM   Modules accepted: Orders

## 2022-07-24 LAB — CYTOLOGY, (ORAL, ANAL, URETHRAL) ANCILLARY ONLY
Chlamydia: NEGATIVE
Comment: NEGATIVE
Comment: NEGATIVE
Comment: NORMAL
Neisseria Gonorrhea: NEGATIVE
Trichomonas: NEGATIVE

## 2022-07-28 ENCOUNTER — Encounter: Payer: Self-pay | Admitting: Family Medicine

## 2022-07-28 ENCOUNTER — Ambulatory Visit: Payer: BC Managed Care – PPO | Admitting: Family Medicine

## 2022-07-28 VITALS — BP 120/76 | Ht 72.0 in | Wt 187.0 lb

## 2022-07-28 DIAGNOSIS — M778 Other enthesopathies, not elsewhere classified: Secondary | ICD-10-CM

## 2022-07-28 NOTE — Patient Instructions (Signed)
Good to see you Please use heat before exercise and ice after  Please continue the exercises  Please send me a message in MyChart with any questions or updates.  Please see me back as needed.   --Dr. Raeford Razor

## 2022-07-28 NOTE — Progress Notes (Signed)
  Marc Allen - 62 y.o. male MRN [id number]  Date of birth: 08/31/1960  SUBJECTIVE:  Including CC & ROS.  No chief complaint on file.   Marc Allen is a 62 y.o. male that is  following up for his right shoulder pain. Doing well since the injection. Has only a mild twinge in his shoulder.    Review of Systems See HPI   HISTORY: Past Medical, Surgical, Social, and Family History Reviewed & Updated per EMR.   Pertinent Historical Findings include:  Past Medical History:  Diagnosis Date   Allergy    Elevated LFTs 03/31/2017   Flat feet    GERD (gastroesophageal reflux disease)    not currenlty on any meds   Hyperlipidemia, mild 02/13/2016   Plantar fasciitis    Preventative health care 05/03/2011   Seasonal allergies    TMJ syndrome     Past Surgical History:  Procedure Laterality Date   COLONOSCOPY     ESOPHAGOGASTRODUODENOSCOPY     ESOPHAGOGASTRODUODENOSCOPY (EGD) WITH PROPOFOL N/A 02/14/2019   Procedure: ESOPHAGOGASTRODUODENOSCOPY (EGD) WITH PROPOFOL;  Surgeon: Lavena Bullion, DO;  Location: [facility];  Service: Gastroenterology;  Laterality: N/A;   TRANSORAL INCISIONLESS FUNDOPLICATION N/A 1/0/6269   Procedure: TRANSORAL INCISIONLESS FUNDOPLICATION;  Surgeon: Lavena Bullion, DO;  Location: [facility];  Service: Gastroenterology;  Laterality: N/A;     PHYSICAL EXAM:  VS: BP 120/76   Ht 6' (1.829 m)   Wt 187 lb (84.8 kg)   BMI 25.36 kg/m  Physical Exam Gen: NAD, alert, cooperative with exam, well-appearing MSK:  Neurovascularly intact       ASSESSMENT & PLAN:   Capsulitis of right shoulder Doing well since the injection. Has regained normal range of motion  - counseled on home exercise therapy and supportive care - could consider PT.

## 2022-07-28 NOTE — Assessment & Plan Note (Signed)
Doing well since the injection. Has regained normal range of motion  - counseled on home exercise therapy and supportive care - could consider PT.

## 2022-09-28 ENCOUNTER — Encounter: Payer: Self-pay | Admitting: *Deleted

## 2022-10-14 NOTE — Assessment & Plan Note (Deleted)
Encouraged heart healthy diet, increase exercise, avoid trans fats 

## 2022-10-14 NOTE — Assessment & Plan Note (Deleted)
Avoid offending foods, start probiotics. Do not eat large meals in late evening and consider raising head of bed.  

## 2022-10-14 NOTE — Assessment & Plan Note (Deleted)
Patient encouraged to maintain heart healthy diet, regular exercise, adequate sleep. Consider daily probiotics. Take medications as prescribed. Labs reviewed. Given and reviewed copy of ACP documents from [facility] Secretary of State and encouraged to complete and return colonoscopy 2015 repeat in 2025

## 2022-10-15 ENCOUNTER — Encounter: Payer: BC Managed Care – PPO | Admitting: Family Medicine

## 2022-10-15 DIAGNOSIS — Z0000 Encounter for general adult medical examination without abnormal findings: Secondary | ICD-10-CM

## 2022-10-15 DIAGNOSIS — K2100 Gastro-esophageal reflux disease with esophagitis, without bleeding: Secondary | ICD-10-CM

## 2022-10-15 DIAGNOSIS — E785 Hyperlipidemia, unspecified: Secondary | ICD-10-CM

## 2022-11-05 ENCOUNTER — Ambulatory Visit: Payer: BC Managed Care – PPO | Admitting: Family Medicine

## 2023-01-19 ENCOUNTER — Ambulatory Visit (INDEPENDENT_AMBULATORY_CARE_PROVIDER_SITE_OTHER): Payer: BC Managed Care – PPO | Admitting: Family Medicine

## 2023-01-19 VITALS — BP 128/82 | HR 61 | Temp 98.0°F | Resp 16 | Ht 72.0 in | Wt 183.8 lb

## 2023-01-19 DIAGNOSIS — R300 Dysuria: Secondary | ICD-10-CM | POA: Diagnosis not present

## 2023-01-19 DIAGNOSIS — Z23 Encounter for immunization: Secondary | ICD-10-CM | POA: Diagnosis not present

## 2023-01-19 DIAGNOSIS — R000 Tachycardia, unspecified: Secondary | ICD-10-CM

## 2023-01-19 DIAGNOSIS — R7989 Other specified abnormal findings of blood chemistry: Secondary | ICD-10-CM

## 2023-01-19 DIAGNOSIS — R972 Elevated prostate specific antigen [PSA]: Secondary | ICD-10-CM

## 2023-01-19 DIAGNOSIS — R739 Hyperglycemia, unspecified: Secondary | ICD-10-CM

## 2023-01-19 DIAGNOSIS — K219 Gastro-esophageal reflux disease without esophagitis: Secondary | ICD-10-CM

## 2023-01-19 DIAGNOSIS — Z1211 Encounter for screening for malignant neoplasm of colon: Secondary | ICD-10-CM

## 2023-01-19 DIAGNOSIS — R399 Unspecified symptoms and signs involving the genitourinary system: Secondary | ICD-10-CM

## 2023-01-19 DIAGNOSIS — Z0000 Encounter for general adult medical examination without abnormal findings: Secondary | ICD-10-CM | POA: Diagnosis not present

## 2023-01-19 DIAGNOSIS — E785 Hyperlipidemia, unspecified: Secondary | ICD-10-CM | POA: Diagnosis not present

## 2023-01-19 DIAGNOSIS — K2100 Gastro-esophageal reflux disease with esophagitis, without bleeding: Secondary | ICD-10-CM | POA: Diagnosis not present

## 2023-01-19 DIAGNOSIS — K449 Diaphragmatic hernia without obstruction or gangrene: Secondary | ICD-10-CM

## 2023-01-19 MED ORDER — FAMOTIDINE 40 MG PO TABS: 40.0000 mg | ORAL_TABLET | Freq: Every day | ORAL | 3 refills | Status: DC

## 2023-01-19 NOTE — Patient Instructions (Addendum)
Flu and covid each fall    Preventive Care 45-60 Years Old, Male Preventive care refers to lifestyle choices and visits with your health care provider that can promote health and wellness. Preventive care visits are also called wellness exams. What can I expect for my preventive care visit? Counseling During your preventive care visit, your health care provider may ask about your: Medical history, including: Past medical problems. Family medical history. Current health, including: Emotional well-being. Home life and relationship well-being. Sexual activity. Lifestyle, including: Alcohol, nicotine or tobacco, and drug use. Access to firearms. Diet, exercise, and sleep habits. Safety issues such as seatbelt and bike helmet use. Sunscreen use. Work and work Astronomer. Physical exam Your health care provider will check your: Height and weight. These may be used to calculate your BMI (body mass index). BMI is a measurement that tells if you are at a healthy weight. Waist circumference. This measures the distance around your waistline. This measurement also tells if you are at a healthy weight and may help predict your risk of certain diseases, such as type 2 diabetes and high blood pressure. Heart rate and blood pressure. Body temperature. Skin for abnormal spots. What immunizations do I need?  Vaccines are usually given at various ages, according to a schedule. Your health care provider will recommend vaccines for you based on your age, medical history, and lifestyle or other factors, such as travel or where you work. What tests do I need? Screening Your health care provider may recommend screening tests for certain conditions. This may include: Lipid and cholesterol levels. Diabetes screening. This is done by checking your blood sugar (glucose) after you have not eaten for a while (fasting). Hepatitis B test. Hepatitis C test. HIV (human immunodeficiency virus) test. STI  (sexually transmitted infection) testing, if you are at risk. Lung cancer screening. Prostate cancer screening. Colorectal cancer screening. Talk with your health care provider about your test results, treatment options, and if necessary, the need for more tests. Follow these instructions at home: Eating and drinking  Eat a diet that includes fresh fruits and vegetables, whole grains, lean protein, and low-fat dairy products. Take vitamin and mineral supplements as recommended by your health care provider. Do not drink alcohol if your health care provider tells you not to drink. If you drink alcohol: Limit how much you have to 0-2 drinks a day. Know how much alcohol is in your drink. In the [place] one drink equals one 12 oz bottle of beer (355 mL), one 5 oz glass of wine (148 mL), or one 1 oz glass of hard liquor (44 mL). Lifestyle Brush your teeth every morning and night with fluoride toothpaste. Floss one time each day. Exercise for at least 30 minutes 5 or more days each week. Do not use any products that contain nicotine or tobacco. These products include cigarettes, chewing tobacco, and vaping devices, such as e-cigarettes. If you need help quitting, ask your health care provider. Do not use drugs. If you are sexually active, practice safe sex. Use a condom or other form of protection to prevent STIs. Take aspirin only as told by your health care provider. Make sure that you understand how much to take and what form to take. Work with your health care provider to find out whether it is safe and beneficial for you to take aspirin daily. Find healthy ways to manage stress, such as: Meditation, yoga, or listening to music. Journaling. Talking to a trusted person. Spending time with friends and  family. Minimize exposure to UV radiation to reduce your risk of skin cancer. Safety Always wear your seat belt while driving or riding in a vehicle. Do not drive: If you have been drinking  alcohol. Do not ride with someone who has been drinking. When you are tired or distracted. While texting. If you have been using any mind-altering substances or drugs. Wear a helmet and other protective equipment during sports activities. If you have firearms in your house, make sure you follow all gun safety procedures. What's next? Go to your health care provider once a year for an annual wellness visit. Ask your health care provider how often you should have your eyes and teeth checked. Stay up to date on all vaccines. This information is not intended to replace advice given to you by your health care provider. Make sure you discuss any questions you have with your health care provider. Document Revised: 11/27/2020 Document Reviewed: 11/27/2020 Elsevier Patient Education  2024 [employer].

## 2023-01-21 ENCOUNTER — Encounter: Payer: Self-pay | Admitting: Family Medicine

## 2023-01-21 NOTE — Assessment & Plan Note (Signed)
Symptoms have worsened again. Avoid offending foods, start probiotics. Do not eat large meals in late evening and consider raising head of bed.  Started on Famotidine 40 mg daily and referred to gastroenterology for further evaluation

## 2023-01-21 NOTE — Progress Notes (Signed)
Subjective:    Patient ID: Marc Allen, male    DOB: 12/29/1960, 62 y.o.   MRN: [id number]  Chief Complaint  Patient presents with   Annual Exam    Annual Exam    HPI Discussed the use of AI scribe software for clinical note transcription with the patient, who gave verbal consent to proceed.  History of Present Illness   The patient, with a past medical history of acid reflux, urinary tract infections, and prostatitis, presents with a recurrence of acid reflux symptoms. The patient reports experiencing heartburn and pain, even when sitting, and has had to sleep sitting up due to the severity of the symptoms. These symptoms have been escalating over the past six to twelve months. The patient has been managing the symptoms with Pepcid as needed and has been trying to eat at least three to four hours before bed. Despite these measures, the symptoms persist.  In addition to the acid reflux, the patient also reports a history of urinary tract infections and prostatitis. The patient has been experiencing recurrent urinary tract infections and has a family history of prostatitis. The patient reports a slight burning sensation during urination.  The patient denies any changes in bowel habits, such as changes in consistency, frequency, or presence of blood.  The patient expresses a willingness to receive these vaccinations.        Past Medical History:  Diagnosis Date   Allergy    Elevated LFTs 03/31/2017   Flat feet    GERD (gastroesophageal reflux disease)    not currenlty on any meds   Hyperlipidemia, mild 02/13/2016   Plantar fasciitis    Preventative health care 05/03/2011   Seasonal allergies    TMJ syndrome     Past Surgical History:  Procedure Laterality Date   COLONOSCOPY     ESOPHAGOGASTRODUODENOSCOPY     ESOPHAGOGASTRODUODENOSCOPY (EGD) WITH PROPOFOL N/A 02/14/2019   Procedure: ESOPHAGOGASTRODUODENOSCOPY (EGD) WITH PROPOFOL;  Surgeon: Shellia Cleverly, DO;   Location: [facility];  Service: Gastroenterology;  Laterality: N/A;   TRANSORAL INCISIONLESS FUNDOPLICATION N/A 02/14/2019   Procedure: TRANSORAL INCISIONLESS FUNDOPLICATION;  Surgeon: Shellia Cleverly, DO;  Location: [facility];  Service: Gastroenterology;  Laterality: N/A;    Family History  Problem Relation Age of Onset   Alcohol abuse Paternal Grandfather    Stroke Paternal Grandfather    Hypertension Father    Prostatitis Father    Atrial fibrillation Father    Arthritis Father        DDD   Autism Son    Dementia Maternal Grandmother    Other Mother        Died in car accident   Alcohol abuse Paternal Uncle    Arthritis/Rheumatoid Paternal Uncle    Diabetes Paternal Uncle    Lung cancer Maternal Aunt        smoker   Seizures Son        age 61   Cancer Maternal Uncle        bone cancer, smoker   Colon cancer Neg Hx    Prostate cancer Neg Hx    Rectal cancer Neg Hx    Stomach cancer Neg Hx    Esophageal cancer Neg Hx     Social History   Socioeconomic History   Marital status: Married    Spouse name: Not on file   Number of children: 1   Years of education: Not on file   Highest education level: Not on file  Occupational  History   Not on file  Tobacco Use   Smoking status: Never   Smokeless tobacco: Never  Vaping Use   Vaping status: Never Used  Substance and Sexual Activity   Alcohol use: No   Drug use: No   Sexual activity: Yes    Comment: works at [employer] lives with wife, no dietary restrictions  Other Topics Concern   Not on file  Social History Narrative   [employer] Photographer   Married x 15 years   One son   2 caffeinated beverages daily   Never smoker   No alcohol or drug use or other tobacco      Social Determinants of Corporate investment banker Strain: Not on file  Food Insecurity: Not on file  Transportation Needs: Not on file  Physical Activity: Not on file  Stress: Not on file  Social Connections: Not on file  Intimate Partner  Violence: Not on file    Outpatient Medications Prior to Visit  Medication Sig Dispense Refill   sulfamethoxazole-trimethoprim (BACTRIM DS) 800-160 MG tablet Take 1 tablet by mouth 2 (two) times daily. 28 tablet 0   famotidine (PEPCID) 10 MG tablet Take 10 mg by mouth 2 (two) times daily. As needed     No facility-administered medications prior to visit.    No Known Allergies  Review of Systems  Constitutional:  Negative for chills, fever and malaise/fatigue.  HENT:  Negative for congestion and hearing loss.   Eyes:  Negative for discharge.  Respiratory:  Negative for cough, sputum production and shortness of breath.   Cardiovascular:  Negative for chest pain, palpitations and leg swelling.  Gastrointestinal:  Negative for abdominal pain, blood in stool, constipation, diarrhea, heartburn, nausea and vomiting.  Genitourinary:  Positive for dysuria, frequency and urgency. Negative for hematuria.  Musculoskeletal:  Negative for back pain, falls and myalgias.  Skin:  Negative for rash.  Neurological:  Negative for dizziness, sensory change, loss of consciousness, weakness and headaches.  Endo/Heme/Allergies:  Negative for environmental allergies. Does not bruise/bleed easily.  Psychiatric/Behavioral:  Negative for depression and suicidal ideas. The patient is not nervous/anxious and does not have insomnia.        Objective:    Physical Exam Vitals reviewed.  Constitutional:      General: He is not in acute distress.    Appearance: Normal appearance. He is not ill-appearing or diaphoretic.  HENT:     Head: Normocephalic and atraumatic.     Right Ear: Tympanic membrane, ear canal and external ear normal. There is no impacted cerumen.     Left Ear: Tympanic membrane, ear canal and external ear normal. There is no impacted cerumen.     Nose: Nose normal. No rhinorrhea.     Mouth/Throat:     Pharynx: Oropharynx is clear.  Eyes:     General: No scleral icterus.    Extraocular  Movements: Extraocular movements intact.     Conjunctiva/sclera: Conjunctivae normal.     Pupils: Pupils are equal, round, and reactive to light.  Neck:     Thyroid: No thyroid mass or thyroid tenderness.  Cardiovascular:     Rate and Rhythm: Normal rate and regular rhythm.     Pulses: Normal pulses.     Heart sounds: Normal heart sounds. No murmur Lawyer. Pulmonary:     Effort: Pulmonary effort is normal.     Breath sounds: Normal breath sounds. No wheezing.  Abdominal:     General: Bowel sounds are normal.  Palpations: Abdomen is soft. There is no mass.     Tenderness: There is no guarding.  Musculoskeletal:        General: No swelling. Normal range of motion.     Cervical back: Normal range of motion and neck supple. No rigidity.     Right lower leg: No edema.     Left lower leg: No edema.  Lymphadenopathy:     Cervical: No cervical adenopathy.  Skin:    General: Skin is warm and dry.     Findings: No rash.  Neurological:     General: No focal deficit present.     Mental Status: He is alert and oriented to person, place, and time.     Cranial Nerves: No cranial nerve deficit.     Deep Tendon Reflexes: Reflexes normal.  Psychiatric:        Mood and Affect: Mood normal.        Behavior: Behavior normal.     BP 128/82 (BP Location: Left Arm, Patient Position: Sitting, Cuff Size: Normal)   Pulse 61   Temp 98 F (36.7 C) (Oral)   Resp 16   Ht 6' (1.829 m)   Wt 183 lb 12.8 oz (83.4 kg)   SpO2 96%   BMI 24.93 kg/m  Wt Readings from Last 3 Encounters:  01/19/23 183 lb 12.8 oz (83.4 kg)  07/28/22 187 lb (84.8 kg)  07/21/22 186 lb 6 oz (84.5 kg)    Diabetic Foot Exam - Simple   No data filed    Lab Results  Component Value Date   WBC 7.0 01/19/2023   HGB 14.9 01/19/2023   HCT 45.3 01/19/2023   PLT 281.0 01/19/2023   GLUCOSE 85 01/19/2023   CHOL 206 (H) 01/19/2023   TRIG 50.0 01/19/2023   HDL 51.80 01/19/2023   LDLCALC 144 (H) 01/19/2023   ALT 18  01/19/2023   AST 17 01/19/2023   NA 141 01/19/2023   K 4.5 01/19/2023   CL 106 01/19/2023   CREATININE 0.90 01/19/2023   BUN 9 01/19/2023   CO2 29 01/19/2023   TSH 0.90 01/19/2023   PSA 2.74 01/19/2023   HGBA1C 5.6 01/19/2023    Lab Results  Component Value Date   TSH 0.90 01/19/2023   Lab Results  Component Value Date   WBC 7.0 01/19/2023   HGB 14.9 01/19/2023   HCT 45.3 01/19/2023   MCV 98.7 01/19/2023   PLT 281.0 01/19/2023   Lab Results  Component Value Date   NA 141 01/19/2023   K 4.5 01/19/2023   CO2 29 01/19/2023   GLUCOSE 85 01/19/2023   BUN 9 01/19/2023   CREATININE 0.90 01/19/2023   BILITOT 0.8 01/19/2023   ALKPHOS 64 01/19/2023   AST 17 01/19/2023   ALT 18 01/19/2023   PROT 7.2 01/19/2023   ALBUMIN 4.5 01/19/2023   CALCIUM 9.7 01/19/2023   GFR 93.34 01/19/2023   Lab Results  Component Value Date   CHOL 206 (H) 01/19/2023   Lab Results  Component Value Date   HDL 51.80 01/19/2023   Lab Results  Component Value Date   LDLCALC 144 (H) 01/19/2023   Lab Results  Component Value Date   TRIG 50.0 01/19/2023   Lab Results  Component Value Date   CHOLHDL 4 01/19/2023   Lab Results  Component Value Date   HGBA1C 5.6 01/19/2023       Assessment & Plan:  Screen for colon cancer -     Ambulatory referral  to Gastroenterology  Gastroesophageal reflux disease with esophagitis without hemorrhage -     Ambulatory referral to Gastroenterology -     Ambulatory referral to Urology  Elevated LFTs -     Comprehensive metabolic panel; Future  Hyperlipidemia, mild -     Lipid panel; Future  Tachycardia -     CBC with Differential/Platelet; Future -     TSH; Future  Elevated PSA -     PSA; Future  Hyperglycemia -     TSH; Future -     Hemoglobin A1c; Future  Dysuria -     Urinalysis, Routine w reflex microscopic; Future -     Urine Culture; Future  Need for Tdap vaccination -     Tdap vaccine greater than or equal to 7yo IM  Need  for shingles vaccine -     Varicella-zoster vaccine IM  UTI symptoms Assessment & Plan: Noted dysuria with a history of prostatitis and a FH of prostatitis in his father. Will refer to urology for consideration and check labs   Preventative health care Assessment & Plan: Patient encouraged to maintain heart healthy diet, regular exercise, adequate sleep. Consider daily probiotics. Take medications as prescribed. Labs ordered andreviewed. Given and reviewed copy of ACP documents from [employer] and encouraged to complete and return. Referred to gastroenterology for next screening colonoscopy last one was 2015   Hiatal hernia with gastroesophageal reflux Assessment & Plan: Symptoms have worsened again. Avoid offending foods, start probiotics. Do not eat large meals in late evening and consider raising head of bed.  Started on Famotidine 40 mg daily and referred to gastroenterology for further evaluation   Other orders -     Famotidine; Take 1 tablet (40 mg total) by mouth at bedtime.  Dispense: 30 tablet; Refill: 3    Assessment and Plan    Gastroesophageal Reflux Disease (GERD) Severe heartburn and discomfort for the past 6-12 months, requiring the patient to sleep in a sitting position. Currently using Pepcid as needed. Discussed the potential risks of long-term use of Pepcid, including bone loss -Start Famotidine 40mg  in the evenings to suppress acid production. -If insufficient relief, consider a two-week trial of Famotidine in the evening and over-the-counter Omeprazole in the morning. -Refer to gastroenterology for further evaluation.  Colon Cancer Screening Last colonoscopy was in 2015. No recent changes in bowel habits, consistency, or frequency. No blood or black, sticky stools. -Schedule colonoscopy with Dr. Dorthula Perfect.  Recurrent Urinary Tract Infections (UTIs) and Prostatitis History of recurrent UTIs and prostatitis. Mild burning sensation during urination.  Family history of prostatitis. -Refer to urology for further evaluation and management. -Consider cranberry tablets to change the bladder environment and make it less hospitable to bacteria. -Encourage increased water intake to reduce the acidity of urine.  Anemia Mild anemia noted on previous lab work. -Order blood work to check blood count, kidney function, and sugar levels.  Immunizations Last tetanus shot was in 2009. Received the old shingles vaccine over 10 years ago. -Administer tetanus shot and the new shingles vaccine. -Plan for the second shingles vaccine in 2-6 months.  Follow-up Schedule a 44-month and 54-month follow-up visit. The 18-month visit can be cancelled if the patient is doing well and the 14-month visit will be for a physical.         Danise Edge, MD

## 2023-01-21 NOTE — Assessment & Plan Note (Signed)
Noted dysuria with a history of prostatitis and a FH of prostatitis in his father. Will refer to urology for consideration and check labs

## 2023-01-21 NOTE — Assessment & Plan Note (Signed)
Patient encouraged to maintain heart healthy diet, regular exercise, adequate sleep. Consider daily probiotics. Take medications as prescribed. Labs ordered andreviewed. Given and reviewed copy of ACP documents from [employer] and encouraged to complete and return. Referred to gastroenterology for next screening colonoscopy last one was 2015

## 2023-02-11 ENCOUNTER — Encounter: Payer: Self-pay | Admitting: Urology

## 2023-02-11 ENCOUNTER — Ambulatory Visit (INDEPENDENT_AMBULATORY_CARE_PROVIDER_SITE_OTHER): Payer: BC Managed Care – PPO | Admitting: Urology

## 2023-02-11 VITALS — BP 120/78 | HR 55 | Ht 72.0 in | Wt 186.0 lb

## 2023-02-11 DIAGNOSIS — N411 Chronic prostatitis: Secondary | ICD-10-CM | POA: Diagnosis not present

## 2023-02-11 MED ORDER — SULFAMETHOXAZOLE-TRIMETHOPRIM 800-160 MG PO TABS
1.0000 | ORAL_TABLET | Freq: Two times a day (BID) | ORAL | 0 refills | Status: AC
Start: 2023-02-11 — End: 2023-03-03

## 2023-02-11 NOTE — Progress Notes (Signed)
Assessment: 1. Chronic prostatitis     Plan: I personally reviewed the patient's chart including provider notes, and lab results. Diagnosis and management of prostatitis discussed with the patient today. Rx for Bactrim DS BID x 20 days provided for prn use Return to office in 6 months   Chief Complaint:  Chief Complaint  Patient presents with   Prostatitis    History of Present Illness:  Marc Allen is a 62 y.o. male who is seen in consultation from Bradd Canary, MD for evaluation of chronic prostatitis.  He has a history of prostatitis occurring approximately 1 time per year for a number of years.  Typical symptoms include dysuria and urethral discharge.  He has had occasional fever and chills.  His symptoms typically resolve with antibiotic therapy.  His last episode was in February 2024.  Urine culture at that time grew >100 K E. coli.  He was treated with Bactrim and his symptoms resolved.  He has not had any recent prostatitis symptoms.  No current dysuria or gross hematuria.  PSA results: 10/19 0.98  3/20 16.73 4/20 3.41 10/21 0.92 2/24 33.27 8/24 2.74   Past Medical History:  Past Medical History:  Diagnosis Date   Allergy    Elevated LFTs 03/31/2017   Flat feet    GERD (gastroesophageal reflux disease)    not currenlty on any meds   Hyperlipidemia, mild 02/13/2016   Plantar fasciitis    Preventative health care 05/03/2011   Seasonal allergies    TMJ syndrome     Past Surgical History:  Past Surgical History:  Procedure Laterality Date   COLONOSCOPY     ESOPHAGOGASTRODUODENOSCOPY     ESOPHAGOGASTRODUODENOSCOPY (EGD) WITH PROPOFOL N/A 02/14/2019   Procedure: ESOPHAGOGASTRODUODENOSCOPY (EGD) WITH PROPOFOL;  Surgeon: Shellia Cleverly, DO;  Location: [facility];  Service: Gastroenterology;  Laterality: N/A;   TRANSORAL INCISIONLESS FUNDOPLICATION N/A 02/14/2019   Procedure: TRANSORAL INCISIONLESS FUNDOPLICATION;  Surgeon: Shellia Cleverly, DO;   Location: [facility];  Service: Gastroenterology;  Laterality: N/A;    Allergies:  No Known Allergies  Family History:  Family History  Problem Relation Age of Onset   Alcohol abuse Paternal Grandfather    Stroke Paternal Grandfather    Hypertension Father    Prostatitis Father    Atrial fibrillation Father    Arthritis Father        DDD   Autism Son    Dementia Maternal Grandmother    Other Mother        Died in car accident   Alcohol abuse Paternal Uncle    Arthritis/Rheumatoid Paternal Uncle    Diabetes Paternal Uncle    Lung cancer Maternal Aunt        smoker   Seizures Son        age 62   Cancer Maternal Uncle        bone cancer, smoker   Colon cancer Neg Hx    Prostate cancer Neg Hx    Rectal cancer Neg Hx    Stomach cancer Neg Hx    Esophageal cancer Neg Hx     Social History:  Social History   Tobacco Use   Smoking status: Never   Smokeless tobacco: Never  Vaping Use   Vaping status: Never Used  Substance Use Topics   Alcohol use: No   Drug use: No    Review of symptoms:  Constitutional:  Negative for unexplained weight loss, night sweats, fever, chills ENT:  Negative for nose  bleeds, sinus pain, painful swallowing CV:  Negative for chest pain, shortness of breath, exercise intolerance, palpitations, loss of consciousness Resp:  Negative for cough, wheezing, shortness of breath GI:  Negative for nausea, vomiting, diarrhea, bloody stools GU:  Positives noted in HPI; otherwise negative for gross hematuria, dysuria, urinary incontinence Neuro:  Negative for seizures, poor balance, limb weakness, slurred speech Psych:  Negative for lack of energy, depression, anxiety Endocrine:  Negative for polydipsia, polyuria, symptoms of hypoglycemia (dizziness, hunger, sweating) Hematologic:  Negative for anemia, purpura, petechia, prolonged or excessive bleeding, use of anticoagulants  Allergic:  Negative for difficulty breathing or choking as a result of  exposure to anything; no shellfish allergy; no allergic response (rash/itch) to materials, foods  Physical exam: BP 120/78   Pulse (!) 55   Ht 6' (1.829 m)   Wt 186 lb (84.4 kg)   BMI 25.23 kg/m  GENERAL APPEARANCE:  Well appearing, well developed, well nourished, NAD HEENT: Atraumatic, Normocephalic, oropharynx clear. NECK: Supple without lymphadenopathy or thyromegaly. LUNGS: Clear to auscultation bilaterally. HEART: Regular Rate and Rhythm without murmurs, gallops, or rubs. ABDOMEN: Soft, non-tender, No Masses. EXTREMITIES: Moves all extremities well.  Without clubbing, cyanosis, or edema. NEUROLOGIC:  Alert and oriented x 3, normal gait, CN II-XII grossly intact.  MENTAL STATUS:  Appropriate. BACK:  Non-tender to palpation.  No CVAT SKIN:  Warm, dry and intact.   GU: Prostate: 40 g, NT, no nodules Rectum: Normal tone,  no masses or tenderness   Results: U/A:  negative

## 2023-02-19 LAB — URINALYSIS, ROUTINE W REFLEX MICROSCOPIC
Bilirubin, UA: NEGATIVE
Glucose, UA: NEGATIVE
Ketones, UA: NEGATIVE
Leukocytes,UA: NEGATIVE
Nitrite, UA: NEGATIVE
Protein,UA: NEGATIVE
RBC, UA: NEGATIVE
Specific Gravity, UA: >1.03 — ABNORMAL HIGH (ref 1.005–1.030)
Urobilinogen, Ur: 0.2 mg/dL (ref 0.2–1.0)
pH, UA: 5.5 (ref 5.0–7.5)

## 2023-04-21 ENCOUNTER — Other Ambulatory Visit: Payer: Self-pay | Admitting: Family Medicine

## 2023-06-18 ENCOUNTER — Ambulatory Visit: Payer: BC Managed Care – PPO | Admitting: Gastroenterology

## 2023-06-24 ENCOUNTER — Ambulatory Visit (INDEPENDENT_AMBULATORY_CARE_PROVIDER_SITE_OTHER): Payer: BC Managed Care – PPO | Admitting: Internal Medicine

## 2023-06-24 ENCOUNTER — Encounter: Payer: Self-pay | Admitting: Internal Medicine

## 2023-06-24 VITALS — BP 124/84 | HR 68 | Ht 72.0 in | Wt 196.2 lb

## 2023-06-24 DIAGNOSIS — K219 Gastro-esophageal reflux disease without esophagitis: Secondary | ICD-10-CM | POA: Diagnosis not present

## 2023-06-24 DIAGNOSIS — Z1211 Encounter for screening for malignant neoplasm of colon: Secondary | ICD-10-CM

## 2023-06-24 DIAGNOSIS — Z9889 Other specified postprocedural states: Secondary | ICD-10-CM

## 2023-06-24 MED ORDER — NA SULFATE-K SULFATE-MG SULF 17.5-3.13-1.6 GM/177ML PO SOLN: 1.0000 | Freq: Once | ORAL | 0 refills | Status: AC

## 2023-06-24 NOTE — Progress Notes (Signed)
 Marc Allen 62 y.o. [birth year] [id number]  Assessment & Plan:   Encounter Diagnoses  Name Primary?   Gastroesophageal reflux disease, unspecified whether esophagitis present Yes   History of fundoplication    Colon cancer screening    Recurrent reflux symptoms status post TIF procedure September 2020.  Symptoms occur when recumbent. Evaluate with EGD and perform screening colonoscopy at the same time.  Schedule with Dr. San since he performed the TIF.  Continue Pepcid  as needed and lifestyle changes.  Copy Dr. San Subjective:   GI History: He has a longstanding history of reflux since at least the 1990's, most recently complicated by dental erosions.  Index sxs of HB and regurgitation, worse at night and early morning along with waterbrash in the morning. No dysphagia or odynophagia.  Previously treated with famotidine  bid with good clinical response, but occasional breakthrough symptoms, particularly at night.  Prior to that, treated with lansoprazole, pantoprazole .   Underwent successful Transoral Incisionless Fundoplication (TIF) on 02/14/2019, and has subsequently weaned off all acid suppression therapy.   Endoscopic history: -EGD (09/2013, Dr. Avram, for Barrett's screening): Normal  -Colonoscopy (09/2013, Dr. Avram): Mild diverticulosis, otherwise normal.  Recommended repeat in 10 years -EGD (07/2018, Dr. San): LA Grade A erosive esophagitis, Hill Grade 2 valve, mild duodenitis -EGD with TIF (02/14/2019, Dr. San): LA Grade A erosive esophagitis, Hill Grade 2 valve treated with successful TIF with placement of 20 fasteners forming a 270 degree valve, 3 cm in length  Chief Complaint: Heartburn/reflux return  HPI 63 year old African-American man status post TIF for GERD problems in 2020 as reflected below.  He did well until the last 1 to 1-1/2 years where he started to have some nocturnal heartburn when lying down at night.  He will wake him up at  night.  If he does not eat in the evening he does not have this problem.  Also if he sleeps more upright he is okay.  Primary care prescribed famotidine  40 mg and when he takes that if he eats it helps significantly though he still has some symptoms.  There is no dysphagia or bleeding.  No unintentional weight loss.  Screening colonoscopy is due in March and he is interested in pursuing that as well.  Note that he did call back after the TIF and had some similar symptoms in 2020 but he tells me today it is really been a problem for the last 1 to 1-1/2 years.  He was treated with PPI and Pepcid  briefly then (2020).  GI review of systems is otherwise negative.  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  2 caffeinated beverages daily.  No alcohol.  Wt Readings from Last 3 Encounters:  06/24/23 196 lb 4 oz (89 kg)  02/11/23 186 lb (84.4 kg)  01/19/23 183 lb 12.8 oz (83.4 kg)     No Known Allergies Current Meds  Medication Sig   famotidine  (PEPCID ) 40 MG tablet TAKE 1 TABLET BY MOUTH EVERYDAY AT BEDTIME   Past Medical History:  Diagnosis Date   Allergy    Elevated LFTs 03/31/2017   Flat feet    GERD (gastroesophageal reflux disease)    not currenlty on any meds   Hyperlipidemia, mild 02/13/2016   Plantar fasciitis    Preventative health care 05/03/2011   Seasonal allergies    TMJ syndrome    Past Surgical History:  Procedure Laterality Date   COLONOSCOPY     ESOPHAGOGASTRODUODENOSCOPY     ESOPHAGOGASTRODUODENOSCOPY (EGD)  WITH PROPOFOL  N/A 02/14/2019   Procedure: ESOPHAGOGASTRODUODENOSCOPY (EGD) WITH PROPOFOL ;  Surgeon: San Sandor GAILS, DO;  Location: [facility];  Service: Gastroenterology;  Laterality: N/A;   TRANSORAL INCISIONLESS FUNDOPLICATION N/A 02/14/2019   Procedure: TRANSORAL INCISIONLESS FUNDOPLICATION;  Surgeon: San Sandor GAILS, DO;  Location: [facility];  Service: Gastroenterology;  Laterality: N/A;   Social History   Social History Narrative   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   Review of Systems As per HPI Urinary frequency and pain seeing urology otherwise negative Objective:   Physical Exam @BP  124/84   Pulse 68   Ht 6' (1.829 m)   Wt 196 lb 4 oz (89 kg)   SpO2 96%   BMI 26.62 kg/m @  General:  NAD Eyes:   anicteric Lungs:  clear Heart::  S1S2 no rubs, murmurs or gallops Abdomen:  soft and nontender, BS+ Ext:   no edema, cyanosis or clubbing    Data Reviewed:  See HPI

## 2023-06-24 NOTE — Patient Instructions (Signed)
You have been scheduled for an endoscopy and colonoscopy. Please follow the written instructions given to you at your visit today.  Please pick up your prep supplies at the pharmacy within the next 1-3 days.  If you use inhalers (even only as needed), please bring them with you on the day of your procedure.  DO NOT TAKE 7 DAYS PRIOR TO TEST- Trulicity (dulaglutide) Ozempic, Wegovy (semaglutide) Mounjaro (tirzepatide) Bydureon Bcise (exanatide extended release)  DO NOT TAKE 1 DAY PRIOR TO YOUR TEST Rybelsus (semaglutide) Adlyxin (lixisenatide) Victoza (liraglutide) Byetta (exanatide) ___________________________________________________________________________  I appreciate the opportunity to care for you. Stan Head, MD, [facility]

## 2023-06-29 ENCOUNTER — Telehealth: Payer: Self-pay | Admitting: *Deleted

## 2023-06-29 DIAGNOSIS — Z9889 Other specified postprocedural states: Secondary | ICD-10-CM

## 2023-06-29 DIAGNOSIS — K219 Gastro-esophageal reflux disease without esophagitis: Secondary | ICD-10-CM

## 2023-06-29 DIAGNOSIS — Z1211 Encounter for screening for malignant neoplasm of colon: Secondary | ICD-10-CM

## 2023-06-29 NOTE — Telephone Encounter (Signed)
 I have spoken to patient to discuss bravo placement in addition to endoscopy/colonoscopy. Patient is agreeable to bravo. He has been advised that the only adjustment to his current endoscopy/colonoscopy prep instructions will be that he should hold his famotidine  7 days prior to his bravo testing. He verbalizes understanding of this. Written instructions regarding bravo have also been made available to patient via mychart for further review.  Bravo order placed in epic; ambulatory referral to GI has been updated as well.

## 2023-06-29 NOTE — Telephone Encounter (Signed)
-----   Message from Sandor LULLA Flatter sent at 06/25/2023  8:06 AM EST ----- Regarding: FW: FYI This patient is scheduled with me for EGD and colonoscopy in early March.  Can you please amend the order so that the EGD is to be done with Bravo (off all acid suppression therapy).  Please update the patient of this adjustment and make sure he is okay with it.  Thanks. ----- Message ----- From: Avram Lupita BRAVO, MD Sent: 06/24/2023   6:01 PM EST To: Sandor Flatter LULLA, DO Subject: FYI                                            I sent this man up for an EGD and a colonoscopy with you.  Status post TIF in 2020 having reflux problems at night.  Needs screening colonoscopy also.  Thanks

## 2023-07-18 NOTE — Assessment & Plan Note (Signed)
 Encourage heart healthy diet such as MIND or DASH diet, increase exercise, avoid trans fats, simple carbohydrates and processed foods, consider a krill or fish or flaxseed oil cap daily.

## 2023-07-18 NOTE — Assessment & Plan Note (Signed)
Avoid offending foods, start probiotics. Do not eat large meals in late evening and consider raising head of bed.  

## 2023-07-18 NOTE — Assessment & Plan Note (Signed)
Is eating well

## 2023-07-22 ENCOUNTER — Ambulatory Visit: Payer: BC Managed Care – PPO | Admitting: Family Medicine

## 2023-07-22 VITALS — BP 112/76 | HR 63 | Temp 98.3°F | Resp 16 | Ht 72.0 in | Wt 198.8 lb

## 2023-07-22 DIAGNOSIS — R399 Unspecified symptoms and signs involving the genitourinary system: Secondary | ICD-10-CM

## 2023-07-22 DIAGNOSIS — Z23 Encounter for immunization: Secondary | ICD-10-CM | POA: Diagnosis not present

## 2023-07-22 DIAGNOSIS — E785 Hyperlipidemia, unspecified: Secondary | ICD-10-CM

## 2023-07-22 DIAGNOSIS — R000 Tachycardia, unspecified: Secondary | ICD-10-CM

## 2023-07-22 DIAGNOSIS — Z9889 Other specified postprocedural states: Secondary | ICD-10-CM | POA: Diagnosis not present

## 2023-07-22 DIAGNOSIS — K449 Diaphragmatic hernia without obstruction or gangrene: Secondary | ICD-10-CM | POA: Diagnosis not present

## 2023-07-22 DIAGNOSIS — K219 Gastro-esophageal reflux disease without esophagitis: Secondary | ICD-10-CM

## 2023-07-22 LAB — POC URINALSYSI DIPSTICK (AUTOMATED)
Blood, UA: NEGATIVE
Glucose, UA: NEGATIVE
Ketones, UA: NEGATIVE
Leukocytes, UA: NEGATIVE
Nitrite, UA: NEGATIVE
Protein, UA: NEGATIVE
Spec Grav, UA: 1.02 (ref 1.010–1.025)
Urobilinogen, UA: 0.2 U/dL
pH, UA: 6 (ref 5.0–8.0)

## 2023-07-22 NOTE — Patient Instructions (Signed)
 RSV, Respiratory Syncitial Virus Vaccine, Arexvy

## 2023-07-26 ENCOUNTER — Encounter: Payer: Self-pay | Admitting: Family Medicine

## 2023-07-26 ENCOUNTER — Other Ambulatory Visit (INDEPENDENT_AMBULATORY_CARE_PROVIDER_SITE_OTHER): Payer: BC Managed Care – PPO

## 2023-07-26 DIAGNOSIS — K219 Gastro-esophageal reflux disease without esophagitis: Secondary | ICD-10-CM

## 2023-07-26 DIAGNOSIS — K449 Diaphragmatic hernia without obstruction or gangrene: Secondary | ICD-10-CM

## 2023-07-26 DIAGNOSIS — E785 Hyperlipidemia, unspecified: Secondary | ICD-10-CM

## 2023-07-26 DIAGNOSIS — R000 Tachycardia, unspecified: Secondary | ICD-10-CM

## 2023-07-26 LAB — COMPREHENSIVE METABOLIC PANEL
ALT: 26 U/L (ref 0–53)
AST: 21 U/L (ref 0–37)
Albumin: 4.4 g/dL (ref 3.5–5.2)
Alkaline Phosphatase: 68 U/L (ref 39–117)
BUN: 11 mg/dL (ref 6–23)
CO2: 29 meq/L (ref 19–32)
Calcium: 9.6 mg/dL (ref 8.4–10.5)
Chloride: 106 meq/L (ref 96–112)
Creatinine, Ser: 0.91 mg/dL (ref 0.40–1.50)
GFR: 91.78 mL/min (ref >60.00)
Glucose, Bld: 91 mg/dL (ref 70–99)
Potassium: 4.4 meq/L (ref 3.5–5.1)
Sodium: 143 meq/L (ref 135–145)
Total Bilirubin: 0.7 mg/dL (ref 0.2–1.2)
Total Protein: 7.1 g/dL (ref 6.0–8.3)

## 2023-07-26 LAB — LIPID PANEL
Cholesterol: 176 mg/dL (ref 0–200)
HDL: 50.3 mg/dL (ref >39.00)
LDL Cholesterol: 111 mg/dL — ABNORMAL HIGH (ref 0–99)
NonHDL: 125.61
Total CHOL/HDL Ratio: 3
Triglycerides: 74 mg/dL (ref 0.0–149.0)
VLDL: 14.8 mg/dL (ref 0.0–40.0)

## 2023-07-26 LAB — CBC WITH DIFFERENTIAL/PLATELET
Basophils Absolute: 0 10*3/uL (ref 0.0–0.1)
Basophils Relative: 0.5 % (ref 0.0–3.0)
Eosinophils Absolute: 0 10*3/uL (ref 0.0–0.7)
Eosinophils Relative: 0.3 % (ref 0.0–5.0)
HCT: 42.8 % (ref 39.0–52.0)
Hemoglobin: 14.5 g/dL (ref 13.0–17.0)
Lymphocytes Relative: 21.6 % (ref 12.0–46.0)
Lymphs Abs: 1.5 10*3/uL (ref 0.7–4.0)
MCHC: 33.8 g/dL (ref 30.0–36.0)
MCV: 97.7 fL (ref 78.0–100.0)
Monocytes Absolute: 0.6 10*3/uL (ref 0.1–1.0)
Monocytes Relative: 8 % (ref 3.0–12.0)
Neutro Abs: 4.8 10*3/uL (ref 1.4–7.7)
Neutrophils Relative %: 69.6 % (ref 43.0–77.0)
Platelets: 275 10*3/uL (ref 150.0–400.0)
RBC: 4.38 Mil/uL (ref 4.22–5.81)
RDW: 14 % (ref 11.5–15.5)
WBC: 7 10*3/uL (ref 4.0–10.5)

## 2023-07-26 LAB — TSH: TSH: 0.85 u[IU]/mL (ref 0.35–5.50)

## 2023-07-26 NOTE — Progress Notes (Signed)
 Subjective:    Patient ID: Marc Allen, male    DOB: Jul 02, 1960, 63 y.o.   MRN: [id number]  Chief Complaint  Patient presents with  . Follow-up    6 month    HPI Discussed the use of AI scribe software for clinical note transcription with the patient, who gave verbal consent to proceed.  History of Present Illness   The patient, with a history of urinary issues, reports no significant worsening of symptoms. He describes experiencing frequent urination and some flow issues, but these have not been rapidly progressing. The patient also mentions an upcoming colonoscopy and ongoing issues with heartburn. He has been experiencing high skin temperature, particularly at night, but reports no discomfort from this. The patient is due for a second shingles vaccine and has a lab appointment scheduled for the following week.        Past Medical History:  Diagnosis Date  . Allergy   . Elevated LFTs 03/31/2017  . Flat feet   . GERD (gastroesophageal reflux disease)    not currenlty on any meds  . Hyperlipidemia, mild 02/13/2016  . Plantar fasciitis   . Preventative health care 05/03/2011  . Seasonal allergies   . TMJ syndrome     Past Surgical History:  Procedure Laterality Date  . COLONOSCOPY    . ESOPHAGOGASTRODUODENOSCOPY    . ESOPHAGOGASTRODUODENOSCOPY (EGD) WITH PROPOFOL  N/A 02/14/2019   Procedure: ESOPHAGOGASTRODUODENOSCOPY (EGD) WITH PROPOFOL ;  Surgeon: San Sandor GAILS, DO;  Location: [facility];  Service: Gastroenterology;  Laterality: N/A;  . TRANSORAL INCISIONLESS FUNDOPLICATION N/A 02/14/2019   Procedure: TRANSORAL INCISIONLESS FUNDOPLICATION;  Surgeon: San Sandor GAILS, DO;  Location: [facility];  Service: Gastroenterology;  Laterality: N/A;    Family History  Problem Relation Age of Onset  . Alcohol abuse Paternal Grandfather   . Stroke Paternal Grandfather   . Hypertension Father   . Prostatitis Father   . Atrial fibrillation Father   . Arthritis Father         DDD  . Autism Son   . Dementia Maternal Grandmother   . Other Mother        Died in car accident  . Alcohol abuse Paternal Uncle   . Arthritis/Rheumatoid Paternal Uncle   . Diabetes Paternal Uncle   . Lung cancer Maternal Aunt        smoker  . Seizures Son        age 44  . Cancer Maternal Uncle        bone cancer, smoker  . Colon cancer Neg Hx   . Prostate cancer Neg Hx   . Rectal cancer Neg Hx   . Stomach cancer Neg Hx   . Esophageal cancer Neg Hx     Social History   Socioeconomic History  . Marital status: Married    Spouse name: Not on file  . Number of children: 1  . Years of education: Not on file  . Highest education level: Not on file  Occupational History  . Not on file  Tobacco Use  . Smoking status: Never  . Smokeless tobacco: Never  Vaping Use  . Vaping status: Never Used  Substance and Sexual Activity  . Alcohol use: No  . Drug use: No  . Sexual activity: Yes    Comment: works at [employer] lives with wife, no dietary restrictions  Other Topics Concern  . Not on file  Social History Narrative   [employer] Driver   Married x 15 years  One son   2 caffeinated beverages daily   Never smoker   No alcohol or drug use or other tobacco      Social Drivers of Corporate Investment Banker Strain: Not on file  Food Insecurity: Not on file  Transportation Needs: Not on file  Physical Activity: Not on file  Stress: Not on file  Social Connections: Not on file  Intimate Partner Violence: Not on file    Outpatient Medications Prior to Visit  Medication Sig Dispense Refill  . famotidine  (PEPCID ) 40 MG tablet TAKE 1 TABLET BY MOUTH EVERYDAY AT BEDTIME 90 tablet 1   No facility-administered medications prior to visit.    No Known Allergies  Review of Systems  Constitutional:  Negative for fever and malaise/fatigue.  HENT:  Negative for congestion.   Eyes:  Negative for blurred vision.  Respiratory:  Negative for shortness of breath.    Cardiovascular:  Negative for chest pain, palpitations and leg swelling.  Gastrointestinal:  Positive for heartburn. Negative for abdominal pain, blood in stool and nausea.  Genitourinary:  Positive for frequency. Negative for dysuria, flank pain and hematuria.  Musculoskeletal:  Negative for falls.  Skin:  Negative for rash.  Neurological:  Negative for dizziness, loss of consciousness and headaches.  Endo/Heme/Allergies:  Negative for environmental allergies.  Psychiatric/Behavioral:  Negative for depression. The patient is not nervous/anxious.       Objective:    Physical Exam Vitals reviewed.  Constitutional:      Appearance: Normal appearance. He is not ill-appearing.  HENT:     Head: Normocephalic and atraumatic.     Nose: Nose normal.  Eyes:     Conjunctiva/sclera: Conjunctivae normal.  Cardiovascular:     Rate and Rhythm: Normal rate.     Pulses: Normal pulses.     Heart sounds: Normal heart sounds. No murmur Hulce. Pulmonary:     Effort: Pulmonary effort is normal.     Breath sounds: Normal breath sounds. No wheezing.  Abdominal:     Palpations: Abdomen is soft. There is no mass.     Tenderness: There is no abdominal tenderness.  Musculoskeletal:     Cervical back: Normal range of motion.     Right lower leg: No edema.     Left lower leg: No edema.  Skin:    General: Skin is warm and dry.  Neurological:     General: No focal deficit present.     Mental Status: He is alert and oriented to person, place, and time.  Psychiatric:        Mood and Affect: Mood normal.   BP 112/76 (BP Location: Left Arm, Patient Position: Sitting, Cuff Size: Normal)   Pulse 63   Temp 98.3 F (36.8 C) (Oral)   Resp 16   Ht 6' (1.829 m)   Wt 198 lb 12.8 oz (90.2 kg)   SpO2 97%   BMI 26.96 kg/m  Wt Readings from Last 3 Encounters:  07/22/23 198 lb 12.8 oz (90.2 kg)  06/24/23 196 lb 4 oz (89 kg)  02/11/23 186 lb (84.4 kg)    Diabetic Foot Exam - Simple   No data filed     Lab Results  Component Value Date   WBC 7.0 01/19/2023   HGB 14.9 01/19/2023   HCT 45.3 01/19/2023   PLT 281.0 01/19/2023   GLUCOSE 85 01/19/2023   CHOL 206 (H) 01/19/2023   TRIG 50.0 01/19/2023   HDL 51.80 01/19/2023   LDLCALC 144 (H) 01/19/2023  ALT 18 01/19/2023   AST 17 01/19/2023   NA 141 01/19/2023   K 4.5 01/19/2023   CL 106 01/19/2023   CREATININE 0.90 01/19/2023   BUN 9 01/19/2023   CO2 29 01/19/2023   TSH 0.90 01/19/2023   PSA 2.74 01/19/2023   HGBA1C 5.6 01/19/2023    Lab Results  Component Value Date   TSH 0.90 01/19/2023   Lab Results  Component Value Date   WBC 7.0 01/19/2023   HGB 14.9 01/19/2023   HCT 45.3 01/19/2023   MCV 98.7 01/19/2023   PLT 281.0 01/19/2023   Lab Results  Component Value Date   NA 141 01/19/2023   K 4.5 01/19/2023   CO2 29 01/19/2023   GLUCOSE 85 01/19/2023   BUN 9 01/19/2023   CREATININE 0.90 01/19/2023   BILITOT 0.8 01/19/2023   ALKPHOS 64 01/19/2023   AST 17 01/19/2023   ALT 18 01/19/2023   PROT 7.2 01/19/2023   ALBUMIN 4.5 01/19/2023   CALCIUM 9.7 01/19/2023   GFR 93.34 01/19/2023   Lab Results  Component Value Date   CHOL 206 (H) 01/19/2023   Lab Results  Component Value Date   HDL 51.80 01/19/2023   Lab Results  Component Value Date   LDLCALC 144 (H) 01/19/2023   Lab Results  Component Value Date   TRIG 50.0 01/19/2023   Lab Results  Component Value Date   CHOLHDL 4 01/19/2023   Lab Results  Component Value Date   HGBA1C 5.6 01/19/2023       Assessment & Plan:  Hyperlipidemia, mild Assessment & Plan: Encourage heart healthy diet such as MIND or DASH diet, increase exercise, avoid trans fats, simple carbohydrates and processed foods, consider a krill or fish or flaxseed oil cap daily.    Orders: -     Lipid panel; Future -     TSH; Future  History of fundoplication Assessment & Plan: Is eating well   Hiatal hernia with gastroesophageal reflux Assessment & Plan: Avoid offending  foods, start probiotics. Do not eat large meals in late evening and consider raising head of bed.    Orders: -     TSH; Future  Tachycardia -     Comprehensive metabolic panel; Future -     CBC with Differential/Platelet; Future -     TSH; Future  UTI symptoms -     POCT Urinalysis Dipstick (Automated)  Need for shingles vaccine -     Varicella-zoster vaccine IM    Assessment and Plan    Urinary Symptoms Mild flow issues reported, not worsening rapidly. Urologist appointment was cancelled and needs to be rescheduled. -Reschedule urologist appointment. -Collect urine sample today to check for blood or infection.  Gastroesophageal Reflux Disease Ongoing heartburn symptoms despite lifestyle modifications. -Consider further workup, including manometry during upcoming colonoscopy.  Hyperthermia Reports of high skin temperature, especially at night, with normal core temperature. Possible relation to acid reflux or hormonal changes. -Order blood work to check for elevated white blood cell count or thyroid  issues.  Shingles Vaccination Received first dose 6 months ago. -Administer second dose of shingles vaccine today.  Colonoscopy Scheduled for March 9th. -Continue with planned procedure.  General Health Maintenance -Consider Respiratory Syncytial Virus (RSV) vaccine when insurance begins to cover it. -Check blood glucose levels during next lab work. If elevated, consider adding Hemoglobin A1c to future labs. -Schedule virtual follow-up visit in 6 weeks.         Harlene Horton, MD

## 2023-08-12 ENCOUNTER — Ambulatory Visit: Payer: BC Managed Care – PPO | Admitting: Urology

## 2023-08-23 ENCOUNTER — Ambulatory Visit (AMBULATORY_SURGERY_CENTER): Payer: BC Managed Care – PPO | Admitting: Gastroenterology

## 2023-08-23 ENCOUNTER — Encounter: Payer: Self-pay | Admitting: Gastroenterology

## 2023-08-23 VITALS — BP 111/87 | HR 66 | Temp 98.3°F | Resp 10 | Ht 72.0 in | Wt 196.0 lb

## 2023-08-23 DIAGNOSIS — K219 Gastro-esophageal reflux disease without esophagitis: Secondary | ICD-10-CM

## 2023-08-23 DIAGNOSIS — K5730 Diverticulosis of large intestine without perforation or abscess without bleeding: Secondary | ICD-10-CM | POA: Diagnosis not present

## 2023-08-23 DIAGNOSIS — T85591A Other mechanical complication of esophageal anti-reflux device, initial encounter: Secondary | ICD-10-CM | POA: Diagnosis not present

## 2023-08-23 DIAGNOSIS — Z1211 Encounter for screening for malignant neoplasm of colon: Secondary | ICD-10-CM

## 2023-08-23 DIAGNOSIS — R12 Heartburn: Secondary | ICD-10-CM

## 2023-08-23 DIAGNOSIS — Z9889 Other specified postprocedural states: Secondary | ICD-10-CM | POA: Diagnosis not present

## 2023-08-23 MED ORDER — SODIUM CHLORIDE 0.9 % IV SOLN: 500.0000 mL | Freq: Once | INTRAVENOUS | Status: DC

## 2023-08-23 NOTE — Op Note (Signed)
 [facility] Patient Name: Marc Allen Procedure Date: 08/23/2023 9:02 AM MRN: [id number] Endoscopist: Doristine Locks , MD, [id number] Age: 63 Referring MD:  Date of Birth: 06-22-1960 Gender: Male Account #: [id number] Procedure:                Upper GI endoscopy Indications:              Heartburn                           History of TIF in 02/2019 and recent recurrence of                            nocturnal/supine heartburn and regurgitation. Medicines:                Monitored Anesthesia Care Procedure:                Pre-Anesthesia Assessment:                           - Prior to the procedure, a History and Physical                            was performed, and patient medications and                            allergies were reviewed. The patient's tolerance of                            previous anesthesia was also reviewed. The risks                            and benefits of the procedure and the sedation                            options and risks were discussed with the patient.                            All questions were answered, and informed consent                            was obtained. Prior Anticoagulants: The patient has                            taken no anticoagulant or antiplatelet agents. ASA                            Grade Assessment: II - A patient with mild systemic                            disease. After reviewing the risks and benefits,                            the patient was deemed in satisfactory condition to  undergo the procedure.                           After obtaining informed consent, the endoscope was                            passed under direct vision. Throughout the                            procedure, the patient's blood pressure, pulse, and                            oxygen saturations were monitored continuously. The                            GIF [id number] #[id number] was introduced through the                             mouth, and advanced to the second part of duodenum.                            The upper GI endoscopy was accomplished without                            difficulty. The patient tolerated the procedure                            well. Scope In: Scope Out: Findings:                 Evidence of a transoral incisionless fundoplication                            (TIF) was found at the gastroesophageal junction                            and in the cardia. This was traversed. Under full                            air insufflation, there was laxity noted in the                            anterior corner of the wrap. The air was removed at                            the conclusion of the procedure.                           The Z-line was regular and was found 42 cm from the                            incisors. The BRAVO capsule with delivery system  was introduced through the mouth and advanced into                            the esophagus, such that the BRAVO pH capsule was                            positioned 36 cm from the incisors, which was 6 cm                            proximal to the GE junction. The BRAVO pH capsule                            was then deployed and attached to the esophageal                            mucosa. The delivery system was then withdrawn.                            Endoscopy was utilized for probe placement and                            diagnostic evaluation. The scope was reinserted to                            evaluate placement of the BRAVO capsule.                            Visualization showed the BRAVO capsule to be in an                            appropriate position.                           The entire examined stomach was otherwise normal.                           The examined duodenum was normal. Complications:            No immediate complications. Estimated Blood Loss:     Estimated blood  loss: none. Impression:               - A transoral incisionless fundoplication (TIF) was                            found. After full air insufflation, the wrap                            appears loose in the anterior corner.                           - Z-line regular, 42 cm from the incisors.                           - Normal stomach.                           -  Normal examined duodenum.                           - The BRAVO pH capsule was deployed.                           - No specimens collected. Recommendation:           - Patient has a contact number available for                            emergencies. The signs and symptoms of potential                            delayed complications were discussed with the                            patient. Return to normal activities tomorrow.                            Written discharge instructions were provided to the                            patient.                           - Resume previous diet.                           - No acid suppression therapy for the next 48 hours.                           - Will follow-up on the results of the Bravo study.                           - Colonoscopy today as scheduled. Doristine Locks, MD 08/23/2023 9:40:12 AM

## 2023-08-23 NOTE — Progress Notes (Signed)
 A/O x 3, gd SR's, VSS, report to RN

## 2023-08-23 NOTE — Progress Notes (Signed)
 Pt given instruction regarding Bravo recording

## 2023-08-23 NOTE — Progress Notes (Signed)
 GASTROENTEROLOGY PROCEDURE H&P NOTE   Primary Care Physician: Bradd Canary, MD    Reason for Procedure:   GERD, regurgitation, hx of TIF, CRC screening  Plan:    EGD, Bravo placement, colonoscopy  Patient is appropriate for endoscopic procedure(s) in the ambulatory ([facility]) setting.  The nature of the procedure, as well as the risks, benefits, and alternatives were carefully and thoroughly reviewed with the patient. Ample time for discussion and questions allowed. The patient understood, was satisfied, and agreed to proceed.     HPI: Marc Allen is a 63 y.o. male who presents for EGD with Bravo placement for evaluation of breakthrough nocturnal reflux sxs (heartburn when supine) after TIF in 02/2019.   Additionally, presents for colonsocopy for ongoing CRC screening. Last colonoscopy was 09/2013 and notable for diverticulosis, but otherwise normal.    Past Medical History:  Diagnosis Date   Allergy    Elevated LFTs 03/31/2017   Flat feet    GERD (gastroesophageal reflux disease)    not currenlty on any meds   Hyperlipidemia, mild 02/13/2016   Plantar fasciitis    Preventative health care 05/03/2011   Seasonal allergies    TMJ syndrome     Past Surgical History:  Procedure Laterality Date   COLONOSCOPY     ESOPHAGOGASTRODUODENOSCOPY     ESOPHAGOGASTRODUODENOSCOPY (EGD) WITH PROPOFOL N/A 02/14/2019   Procedure: ESOPHAGOGASTRODUODENOSCOPY (EGD) WITH PROPOFOL;  Surgeon: Shellia Cleverly, DO;  Location: [facility];  Service: Gastroenterology;  Laterality: N/A;   TRANSORAL INCISIONLESS FUNDOPLICATION N/A 02/14/2019   Procedure: TRANSORAL INCISIONLESS FUNDOPLICATION;  Surgeon: Shellia Cleverly, DO;  Location: [facility];  Service: Gastroenterology;  Laterality: N/A;   UPPER GASTROINTESTINAL ENDOSCOPY      Prior to Admission medications   Medication Sig Start Date End Date Taking? Authorizing Provider  famotidine (PEPCID) 40 MG tablet TAKE 1 TABLET BY MOUTH  EVERYDAY AT BEDTIME 04/21/23   Bradd Canary, MD    Current Outpatient Medications  Medication Sig Dispense Refill   famotidine (PEPCID) 40 MG tablet TAKE 1 TABLET BY MOUTH EVERYDAY AT BEDTIME 90 tablet 1   Current Facility-Administered Medications  Medication Dose Route Frequency Provider Last Rate Last Admin   0.9 %  sodium chloride infusion  500 mL Intravenous Once Florentino Laabs V, DO        Allergies as of 08/23/2023   (No Known Allergies)    Family History  Problem Relation Age of Onset   Alcohol abuse Paternal Grandfather    Stroke Paternal Grandfather    Hypertension Father    Prostatitis Father    Atrial fibrillation Father    Arthritis Father        DDD   Autism Son    Dementia Maternal Grandmother    Other Mother        Died in car accident   Alcohol abuse Paternal Uncle    Arthritis/Rheumatoid Paternal Uncle    Diabetes Paternal Uncle    Lung cancer Maternal Aunt        smoker   Seizures Son        age 72   Cancer Maternal Uncle        bone cancer, smoker   Colon cancer Neg Hx    Prostate cancer Neg Hx    Rectal cancer Neg Hx    Stomach cancer Neg Hx    Esophageal cancer Neg Hx     Social History   Socioeconomic History   Marital status: Married  Spouse name: Not on file   Number of children: 1   Years of education: Not on file   Highest education level: Not on file  Occupational History   Not on file  Tobacco Use   Smoking status: Never   Smokeless tobacco: Never  Vaping Use   Vaping status: Never Used  Substance and Sexual Activity   Alcohol use: No   Drug use: No   Sexual activity: Yes    Comment: works at [employer] lives with wife, no dietary restrictions  Other Topics Concern   Not on file  Social History Narrative   [employer] Photographer   Married x 15 years   One son   2 caffeinated beverages daily   Never smoker   No alcohol or drug use or other tobacco      Social Drivers of Corporate investment banker Strain: Not on file   Food Insecurity: Not on file  Transportation Needs: Not on file  Physical Activity: Not on file  Stress: Not on file  Social Connections: Not on file  Intimate Partner Violence: Not on file    Physical Exam: Vital signs in last 24 hours: @BP  119/80   Pulse (!) 56   Temp 98.3 F (36.8 C)   Ht 6' (1.829 m)   Wt 196 lb (88.9 kg)   SpO2 98%   BMI 26.58 kg/m  GEN: NAD EYE: Sclerae anicteric ENT: MMM CV: Non-tachycardic Pulm: CTA b/l GI: Soft, NT/ND NEURO:  Alert & Oriented x 3   Doristine Locks, DO [facility]   08/23/2023 8:55 AM

## 2023-08-23 NOTE — Op Note (Signed)
 [facility] Patient Name: Marc Allen Procedure Date: 08/23/2023 9:01 AM MRN: [id number] Endoscopist: Doristine Locks , MD, [id number] Age: 63 Referring MD:  Date of Birth: 1961-04-02 Gender: Male Account #: [id number] Procedure:                Colonoscopy Indications:              Screening for colorectal malignant neoplasm                           Last colonoscopy was 09/2013 and notable for                            diverticulosis, but otherwise normal. Medicines:                Monitored Anesthesia Care Procedure:                Pre-Anesthesia Assessment:                           - Prior to the procedure, a History and Physical                            was performed, and patient medications and                            allergies were reviewed. The patient's tolerance of                            previous anesthesia was also reviewed. The risks                            and benefits of the procedure and the sedation                            options and risks were discussed with the patient.                            All questions were answered, and informed consent                            was obtained. Prior Anticoagulants: The patient has                            taken no anticoagulant or antiplatelet agents. ASA                            Grade Assessment: II - A patient with mild systemic                            disease. After reviewing the risks and benefits,                            the patient was deemed in satisfactory condition to  undergo the procedure.                           After obtaining informed consent, the colonoscope                            was passed under direct vision. Throughout the                            procedure, the patient's blood pressure, pulse, and                            oxygen saturations were monitored continuously. The                            Olympus Scope [id number] was  introduced through the                            anus and advanced to the the terminal ileum. The                            colonoscopy was performed without difficulty. The                            patient tolerated the procedure well. The quality                            of the bowel preparation was good. The terminal                            ileum, ileocecal valve, appendiceal orifice, and                            rectum were photographed. Scope In: 9:18:47 AM Scope Out: 9:29:44 AM Scope Withdrawal Time: 0 hours 8 minutes 50 seconds  Total Procedure Duration: 0 hours 10 minutes 57 seconds  Findings:                 The perianal and digital rectal examinations were                            normal.                           A few small-mouthed diverticula were found in the                            sigmoid colon and ascending colon.                           The exam was otherwise normal throughout the                            remainder of the colon.  The retroflexed view of the distal rectum and anal                            verge was normal and showed no anal or rectal                            abnormalities.                           The terminal ileum appeared normal. Complications:            No immediate complications. Estimated Blood Loss:     Estimated blood loss: none. Impression:               - Diverticulosis in the sigmoid colon and in the                            ascending colon.                           - The distal rectum and anal verge are normal on                            retroflexion view.                           - The examined portion of the ileum was normal.                           - No specimens collected. Recommendation:           - Patient has a contact number available for                            emergencies. The signs and symptoms of potential                            delayed complications were  discussed with the                            patient. Return to normal activities tomorrow.                            Written discharge instructions were provided to the                            patient.                           - Resume previous diet.                           - Continue present medications.                           - Repeat colonoscopy in 10 years for screening  purposes. Doristine Locks, MD 08/23/2023 9:44:08 AM

## 2023-08-23 NOTE — Progress Notes (Signed)
 Called to room to assist during endoscopic procedure.  Patient ID and intended procedure confirmed with present staff. Received instructions for my participation in the procedure from the performing physician.  Capsule expiration date- 04/13/2024  Capsule ID number [id number]  LES measurement: 42 (capsule placed 6 cm above LES) 36  Time of implant: 913 am

## 2023-08-23 NOTE — Patient Instructions (Signed)
 Do not take any stomach acid reducing medicine for 2 days  Return Bravo device to 4th floor on Wednesday by 3:00  YOU HAD AN ENDOSCOPIC PROCEDURE TODAY AT THE [facility]:   Refer to the procedure report that was given to you for any specific questions about what was found during the examination.  If the procedure report does not answer your questions, please call your gastroenterologist to clarify.  If you requested that your care partner not be given the details of your procedure findings, then the procedure report has been included in a sealed envelope for you to review at your convenience later.  YOU SHOULD EXPECT: Some feelings of bloating in the abdomen. Passage of more gas than usual.  Walking can help get rid of the air that was put into your GI tract during the procedure and reduce the bloating. If you had a lower endoscopy (such as a colonoscopy or flexible sigmoidoscopy) you may notice spotting of blood in your stool or on the toilet paper. If you underwent a bowel prep for your procedure, you may not have a normal bowel movement for a few days.  Please Note:  You might notice some irritation and congestion in your nose or some drainage.  This is from the oxygen used during your procedure.  There is no need for concern and it should clear up in a day or so.  SYMPTOMS TO REPORT IMMEDIATELY:  Following lower endoscopy (colonoscopy or flexible sigmoidoscopy):  Excessive amounts of blood in the stool  Significant tenderness or worsening of abdominal pains  Swelling of the abdomen that is new, acute  Fever of 100F or higher  Following upper endoscopy (EGD)  Vomiting of blood or coffee ground material  New chest pain or pain under the shoulder blades  Painful or persistently difficult swallowing  New shortness of breath  Fever of 100F or higher  Black, tarry-looking stools  For urgent or emergent issues, a gastroenterologist can be reached at any hour by calling [phone]  [phone]. Do not use MyChart messaging for urgent concerns.    DIET:  We do recommend a small meal at first, but then you may proceed to your regular diet.  Drink plenty of fluids but you should avoid alcoholic beverages for 24 hours.  ACTIVITY:  You should plan to take it easy for the rest of today and you should NOT DRIVE or use heavy machinery until tomorrow (because of the sedation medicines used during the test).    FOLLOW UP: Our staff will call the number listed on your records the next business day following your procedure.  We will call around 7:15- 8:00 am to check on you and address any questions or concerns that you may have regarding the information given to you following your procedure. If we do not reach you, we will leave a message.      SIGNATURES/CONFIDENTIALITY: You and/or your care partner have signed paperwork which will be entered into your electronic medical record.  These signatures attest to the fact that that the information above on your After Visit Summary has been reviewed and is understood.  Full responsibility of the confidentiality of this discharge information lies with you and/or your care-partner.  Post-op Bravo pH instructions Once you get home:  Eat normally and go about your daily routine/activities Limit drinking fluids or eating between meals Do not chew gum or eat hard candy DO NOT take any antacid or anti-reflux medications during the 48-hour monitoring time, unless  instructed by your physician  Recording events: Events to be recorded are:  Record using event buttons on recorder and write on paper diary form Every time you eat or drink something (other than water) 2.   Periods of lying down/reclining 3.  Symptoms:  may include heartburn, regurgitation, chest pain, cough or specify if other.  A paper diary is also provided to record the times of your reflux symptoms and times for meals and when you lie down.  The recorder needs to remain within  3 feet (arms length) of you during the testing period (48 hours). If you should forget and move outside of a 3-foot radius of the receiver you may hear beeping and you will see a "C1" error in the display window on the top of the receiver.  Please pick up the receiver and hold close to you to re-establish the connection and the error message disappears.  You may take a bath/shower during the testing period, but the recorder must not get wet and must remain within 3 feet of you. Please leave the receiver outside of the shower or tub while bathing. The monitoring period will be for 48 hours after placement of the capsule.  At the end of the 48 hours, you will return the recorder, and your diary, to our [facility] floor [facility] front desk.  A nurse will meet you to collect the device and answer any questions you may have.  The device should turn off once the 48 hours is complete.   What to expect after placement of the capsule:  Some patients experience a vague sensation that something is in their esophagus or that they 'feel' the capsule when they swallow food.  Should you experience this, chewing food carefully or drinking liquids may minimize this sensation.   After the test is complete, the disposable capsule will fall off the wall of your esophagus within 5-10 days and pass naturally with your bowel movement through the digestive tract.  Once the recorder is returned, your provider will review and interpret your recordings and contact you to discuss your results.  This may take up to two weeks.   DO NOT have an MRI for 30 days after your procedure to ensure the capsule is no longer inside your body  It is imperative that you return the recorder on _______wednesday____ by 3:00pm.  Your information must be downloaded at this time to obtain your results.

## 2023-08-24 ENCOUNTER — Telehealth: Payer: Self-pay

## 2023-08-24 NOTE — Telephone Encounter (Signed)
  Follow up Call-     08/23/2023    8:16 AM  Call back number  Post procedure Call Back phone  # [phone]  Permission to leave phone message Yes    Post op call attempted, no answer, left VM.

## 2023-08-29 NOTE — Assessment & Plan Note (Signed)
 Encourage heart healthy diet such as MIND or DASH diet, increase exercise, avoid trans fats, simple carbohydrates and processed foods, consider a krill or fish or flaxseed oil cap daily.

## 2023-08-29 NOTE — Assessment & Plan Note (Signed)
Avoid offending foods, start probiotics. Do not eat large meals in late evening and consider raising head of bed.  

## 2023-08-31 ENCOUNTER — Encounter: Payer: Self-pay | Admitting: Family Medicine

## 2023-08-31 ENCOUNTER — Telehealth: Payer: BC Managed Care – PPO | Admitting: Family Medicine

## 2023-08-31 DIAGNOSIS — E785 Hyperlipidemia, unspecified: Secondary | ICD-10-CM | POA: Diagnosis not present

## 2023-08-31 DIAGNOSIS — K2100 Gastro-esophageal reflux disease with esophagitis, without bleeding: Secondary | ICD-10-CM | POA: Diagnosis not present

## 2023-08-31 NOTE — Progress Notes (Signed)
 MyChart Video Visit    Virtual Visit via Video Note   This patient is at least at moderate risk for complications without adequate follow up. This format is felt to be most appropriate for this patient at this time. Physical exam was limited by quality of the video and audio technology used for the visit. Juanetta, CMA was able to get the patient set up on a video visit.  Patient location: home Patient and provider in visit Provider location: Office  I discussed the limitations of evaluation and management by telemedicine and the availability of in person appointments. The patient expressed understanding and agreed to proceed.  Visit Date: 08/31/2023  Today's healthcare provider: Danise Edge, MD  Subjective:    Patient ID: Marc Allen, male    DOB: [birth year], 63 y.o.   MRN: [id number]  Chief Complaint  Patient presents with   Follow-up    HPI Discussed the use of AI scribe software for clinical note transcription with the patient, who gave verbal consent to proceed.  History of Present Illness Marc Allen is a 63 year old male who presents with acid reflux symptoms. He was referred by Dr. Matt Holmes for evaluation of esophageal acid exposure and consideration of further intervention.  He experiences acid reflux symptoms, particularly at night, describing the sensation as 'acids coming up'. The symptoms are mild and infrequent. He takes Pepcid at night before lying down, which is when he usually experiences the issue. 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  A recent Bravo acid test results pending. Upper Endoscopy indicated a slight opening in the esophagus allowing some acid to pass through. 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   He expresses concern about pancreatic cancer,  mentioning a coworker who had the disease. He inquires about symptoms which might indicate pancreatic cancer which include loss of appetite, abdominal pain, changes in bowel habits, fatigue, and unexplained weight loss. He notes that he is not experiencing these symptoms, particularly denying weight loss.    Past Medical History:  Diagnosis Date   Allergy    Elevated LFTs 03/31/2017   Flat feet    GERD (gastroesophageal reflux disease)    not currenlty on any meds   Hyperlipidemia, mild 02/13/2016   Plantar fasciitis    Preventative health care 05/03/2011   Seasonal allergies    TMJ syndrome     Past Surgical History:  Procedure Laterality Date   COLONOSCOPY     ESOPHAGOGASTRODUODENOSCOPY     ESOPHAGOGASTRODUODENOSCOPY (EGD) WITH PROPOFOL N/A 02/14/2019   Procedure: ESOPHAGOGASTRODUODENOSCOPY (EGD) WITH PROPOFOL;  Surgeon: Shellia Cleverly, DO;  Location: [facility];  Service: Gastroenterology;  Laterality: N/A;   TRANSORAL INCISIONLESS FUNDOPLICATION N/A 02/14/2019   Procedure: TRANSORAL INCISIONLESS FUNDOPLICATION;  Surgeon: Shellia Cleverly, DO;  Location: [facility];  Service: Gastroenterology;  Laterality: N/A;   UPPER GASTROINTESTINAL ENDOSCOPY      Family History  Problem Relation Age of Onset   Alcohol abuse Paternal Grandfather    Stroke Paternal Grandfather    Hypertension Father    Prostatitis Father    Atrial fibrillation Father    Arthritis Father        DDD   Autism Son    Dementia Maternal Grandmother    Other Mother        Died in  car accident   Alcohol abuse Paternal Uncle    Arthritis/Rheumatoid Paternal Uncle    Diabetes Paternal Uncle    Lung cancer Maternal Aunt        smoker   Seizures Son        age 24   Cancer Maternal Uncle        bone cancer, smoker   Colon cancer Neg Hx    Prostate cancer Neg Hx    Rectal cancer Neg Hx    Stomach cancer Neg Hx    Esophageal cancer Neg Hx     Social History   Socioeconomic History    Marital status: Married    Spouse name: Not on file   Number of children: 1   Years of education: Not on file   Highest education level: Not on file  Occupational History   Not on file  Tobacco Use   Smoking status: Never   Smokeless tobacco: Never  Vaping Use   Vaping status: Never Used  Substance and Sexual Activity   Alcohol use: No   Drug use: No   Sexual activity: Yes    Comment: works at [employer] lives with wife, no dietary restrictions  Other Topics Concern   Not on file  Social History Narrative   [employer]   Married x 15 years   One son   2 caffeinated beverages daily   Never smoker   No alcohol or drug use or other tobacco      Social Drivers of Corporate investment banker Strain: Not on file  Food Insecurity: Not on file  Transportation Needs: Not on file  Physical Activity: Not on file  Stress: Not on file  Social Connections: Not on file  Intimate Partner Violence: Not on file    Outpatient Medications Prior to Visit  Medication Sig Dispense Refill   famotidine (PEPCID) 40 MG tablet TAKE 1 TABLET BY MOUTH EVERYDAY AT BEDTIME 90 tablet 1   No facility-administered medications prior to visit.    No Known Allergies  Review of Systems  Constitutional:  Negative for fever and malaise/fatigue.  HENT:  Negative for congestion.   Eyes:  Negative for blurred vision.  Respiratory:  Negative for shortness of breath.   Cardiovascular:  Negative for chest pain, palpitations and leg swelling.  Gastrointestinal:  Positive for heartburn. Negative for abdominal pain, blood in stool, nausea and vomiting.  Genitourinary:  Negative for dysuria and frequency.  Musculoskeletal:  Negative for falls.  Skin:  Negative for rash.  Neurological:  Negative for dizziness, loss of consciousness and headaches.  Endo/Heme/Allergies:  Negative for environmental allergies.  Psychiatric/Behavioral:  Negative for depression. The patient is not nervous/anxious.         Objective:    Physical Exam Constitutional:      General: He is not in acute distress.    Appearance: Normal appearance. He is not ill-appearing or toxic-appearing.  HENT:     Head: Normocephalic and atraumatic.     Right Ear: External ear normal.     Left Ear: External ear normal.     Nose: Nose normal.  Eyes:     General:        Right eye: No discharge.        Left eye: No discharge.  Pulmonary:     Effort: Pulmonary effort is normal.  Skin:    Findings: No rash.  Neurological:     Mental Status: He is alert and oriented to person,  place, and time.  Psychiatric:        Behavior: Behavior normal.     There were no vitals taken for this visit. Wt Readings from Last 3 Encounters:  08/23/23 196 lb (88.9 kg)  07/22/23 198 lb 12.8 oz (90.2 kg)  06/24/23 196 lb 4 oz (89 kg)    Diabetic Foot Exam - Simple   No data filed    Lab Results  Component Value Date   WBC 7.0 07/26/2023   HGB 14.5 07/26/2023   HCT 42.8 07/26/2023   PLT 275.0 07/26/2023   GLUCOSE 91 07/26/2023   CHOL 176 07/26/2023   TRIG 74.0 07/26/2023   HDL 50.30 07/26/2023   LDLCALC 111 (H) 07/26/2023   ALT 26 07/26/2023   AST 21 07/26/2023   NA 143 07/26/2023   K 4.4 07/26/2023   CL 106 07/26/2023   CREATININE 0.91 07/26/2023   BUN 11 07/26/2023   CO2 29 07/26/2023   TSH 0.85 07/26/2023   PSA 2.74 01/19/2023   HGBA1C 5.6 01/19/2023    Lab Results  Component Value Date   TSH 0.85 07/26/2023   Lab Results  Component Value Date   WBC 7.0 07/26/2023   HGB 14.5 07/26/2023   HCT 42.8 07/26/2023   MCV 97.7 07/26/2023   PLT 275.0 07/26/2023   Lab Results  Component Value Date   NA 143 07/26/2023   K 4.4 07/26/2023   CO2 29 07/26/2023   GLUCOSE 91 07/26/2023   BUN 11 07/26/2023   CREATININE 0.91 07/26/2023   BILITOT 0.7 07/26/2023   ALKPHOS 68 07/26/2023   AST 21 07/26/2023   ALT 26 07/26/2023   PROT 7.1 07/26/2023   ALBUMIN 4.4 07/26/2023   CALCIUM 9.6 07/26/2023   GFR 91.78  07/26/2023   Lab Results  Component Value Date   CHOL 176 07/26/2023   Lab Results  Component Value Date   HDL 50.30 07/26/2023   Lab Results  Component Value Date   LDLCALC 111 (H) 07/26/2023   Lab Results  Component Value Date   TRIG 74.0 07/26/2023   Lab Results  Component Value Date   CHOLHDL 3 07/26/2023   Lab Results  Component Value Date   HGBA1C 5.6 01/19/2023       Assessment & Plan:  Gastroesophageal reflux disease with esophagitis without hemorrhage Assessment & Plan: Avoid offending foods, start probiotics. Do not eat large meals in late evening and consider raising head of bed.     Hyperlipidemia, mild Assessment & Plan: Encourage heart healthy diet such as MIND or DASH diet, increase exercise, avoid trans fats, simple carbohydrates and processed foods, consider a krill or fish or flaxseed oil cap daily.       Assessment and Plan Assessment & Plan Gastroesophageal Reflux Disease (GERD) Mild nocturnal acid reflux managed with Famotidine 40 mg at bedtime. Awaiting Bravo test results. Discussed esophageal flap procedures and risks of esophageal cellular changes. Emphasized symptom management and procedural risks. - Consider Tums for quick relief while driving. - Await Bravo test results and consult Dr. Matt Holmes. - Evaluate procedure risk-benefit based on symptoms and Dr. Hinda Lenis advice.  General Health Maintenance Discussed diet's role in reducing heartburn, weight gain, and cancer risk. Recommended Mediterranean and MIND diets. Emphasized minimizing processed foods and lifestyle factors. - Adopt Mediterranean or MIND diet. - Minimize processed foods, choose organic or non-GMO. - Focus on sleep, stress management, and physical activity.  Follow-up Advised follow-up with Dr. Barron Alvine and current provider. Encouraged reporting symptom changes. -  Follow up with Dr. Barron Alvine for Bravo test results and interventions. - Attend follow-up with  current provider in August. - Report symptom changes or need for further discussion.     Danise Edge, MD

## 2023-09-01 DIAGNOSIS — R12 Heartburn: Secondary | ICD-10-CM | POA: Diagnosis not present

## 2023-09-01 DIAGNOSIS — K219 Gastro-esophageal reflux disease without esophagitis: Secondary | ICD-10-CM | POA: Diagnosis not present

## 2023-09-02 ENCOUNTER — Telehealth: Payer: Self-pay | Admitting: Gastroenterology

## 2023-09-02 NOTE — Telephone Encounter (Signed)
 Results from the Bravo study (conducted off acid suppression therapy) were reviewed and notable for the following:  1.  Normal esophageal acid exposure with % time pH <4 of 2.0% (normal <4%).  DeMeester score 6.6 (normal <14.72) 2.  Esophageal acid exposure is normal in upright and supine position 3.  No symptom correlation for heartburn, dysphagia, regurgitation with reflux events based on SAP  Impression: No evidence of significant gastroesophageal reflux disease.    This test overall shows no breakthrough reflux on this 48-hour study which was conducted off all acid suppression therapy.  This would be highly suggestive of an intact TIF which is functioning appropriately.  May have overlapping functional heartburn.  Recommend conservative measures, to include avoidance of eating within 2-3 hours of bedtime, sleep with HOB elevated if needed, avoid overeating.

## 2023-09-03 NOTE — Telephone Encounter (Signed)
 Patient advised of results of Bravo study per Dr Barron Alvine.  Patient advised to avoid eating within 2 to 3 hours of bedtime, sleep with head of bed elevated at night if needed, and avoid overeating.  Patient agreed to plan and verbalized understanding. No further questions.

## 2023-09-13 ENCOUNTER — Encounter: Payer: Self-pay | Admitting: Gastroenterology

## 2023-10-20 ENCOUNTER — Other Ambulatory Visit: Payer: Self-pay | Admitting: Family Medicine

## 2024-01-20 ENCOUNTER — Encounter: Payer: BC Managed Care – PPO | Admitting: Family Medicine
